# Patient Record
Sex: Female | Born: 1980 | Race: White | Hispanic: No | Marital: Single
Health system: Midwestern US, Academic
[De-identification: ages and names within clinical notes are randomized; demographics above are authoritative.]

---
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
## 2023-06-28 ENCOUNTER — Encounter: Admit: 2023-06-28 | Discharge: 2023-06-28 | Payer: MEDICARE

## 2023-06-28 DIAGNOSIS — N3000 Acute cystitis without hematuria: Secondary | ICD-10-CM

## 2023-06-28 DIAGNOSIS — N898 Other specified noninflammatory disorders of vagina: Secondary | ICD-10-CM

## 2023-06-28 LAB — URINALYSIS DIPSTICK REFLEX TO CULTURE: NITRITE: POSITIVE K/UL — AB (ref 3–12)

## 2023-06-28 LAB — OPIATES-URINE RANDOM: OPIATES: POSITIVE mL/min — AB

## 2023-06-28 LAB — CBC
HEMOGLOBIN: 14 g/dL (ref 12.0–15.0)
MCV: 91 FL (ref 80–100)
RDW: 13 % (ref 11–15)
WBC COUNT: 21 K/UL — ABNORMAL HIGH (ref 4.5–11.0)

## 2023-06-28 LAB — PHENCYCLIDINES-URINE RANDOM: PHENCYCLIDINE (PCP): NEGATIVE

## 2023-06-28 LAB — BENZODIAZEPINES-URINE RANDOM: BENZODIAZEPINES: NEGATIVE K/UL (ref 0–0.80)

## 2023-06-28 LAB — URINALYSIS MICROSCOPIC REFLEX TO CULTURE

## 2023-06-28 LAB — FENTANYL URINE: FENTANYL URINE: NEGATIVE

## 2023-06-28 LAB — BARBITURATES-URINE RANDOM: BARBITURATES: NEGATIVE U/L (ref 7–40)

## 2023-06-28 LAB — COMPREHENSIVE METABOLIC PANEL
ALT: 15 U/L — AB (ref 7–56)
CHLORIDE: 101 MMOL/L — AB (ref 98–110)

## 2023-06-28 MED ORDER — HYDROCODONE-ACETAMINOPHEN 10-325 MG PO TAB
1 | Freq: Once | ORAL | 0 refills | Status: CP
Start: 2023-06-28 — End: ?
  Administered 2023-06-29: 04:00:00 1 via ORAL

## 2023-06-28 MED ORDER — HYDROCODONE-ACETAMINOPHEN 5-325 MG PO TAB
1 | Freq: Once | ORAL | 0 refills | Status: CP
Start: 2023-06-28 — End: ?
  Administered 2023-06-29: 04:00:00 1 via ORAL

## 2023-06-28 MED ORDER — CEPHALEXIN 500 MG PO CAP
500 mg | ORAL_CAPSULE | Freq: Two times a day (BID) | ORAL | 0 refills | Status: AC
Start: 2023-06-28 — End: ?

## 2023-06-28 MED ORDER — ULIPRISTAL 30 MG PO TAB
30 mg | Freq: Once | ORAL | 0 refills | Status: CP
Start: 2023-06-28 — End: ?
  Administered 2023-06-29: 04:00:00 30 mg via ORAL

## 2023-06-28 MED ORDER — CEFTRIAXONE/LIDOCAINE IM 500MG VIAL MIXTURE
500 mg | Freq: Once | INTRAMUSCULAR | 0 refills | Status: CP
Start: 2023-06-28 — End: ?
  Administered 2023-06-29 (×2): 500 mg via INTRAMUSCULAR

## 2023-06-28 MED ORDER — PREGABALIN 75 MG PO CAP
300 mg | Freq: Once | ORAL | 0 refills | Status: CP
Start: 2023-06-28 — End: ?
  Administered 2023-06-29: 04:00:00 300 mg via ORAL

## 2023-06-28 MED ORDER — METRONIDAZOLE 500 MG PO TAB
2000 mg | Freq: Once | ORAL | 0 refills | Status: CP
Start: 2023-06-28 — End: ?
  Administered 2023-06-29: 04:00:00 2000 mg via ORAL

## 2023-06-28 MED ORDER — AZITHROMYCIN 250 MG PO TAB
1000 mg | Freq: Once | ORAL | 0 refills | Status: CP
Start: 2023-06-28 — End: ?
  Administered 2023-06-29: 04:00:00 1000 mg via ORAL

## 2023-06-28 MED ORDER — ONDANSETRON 8 MG PO TBDI
8 mg | Freq: Once | ORAL | 0 refills | Status: CP
Start: 2023-06-28 — End: ?
  Administered 2023-06-29: 04:00:00 8 mg via ORAL

## 2023-06-28 MED ORDER — METHOCARBAMOL 500 MG PO TAB
1000 mg | Freq: Once | ORAL | 0 refills | Status: CP
Start: 2023-06-28 — End: ?
  Administered 2023-06-29: 04:00:00 1000 mg via ORAL

## 2023-06-29 ENCOUNTER — Ambulatory Visit: Admit: 2023-06-29 | Discharge: 2023-06-28 | Payer: MEDICARE

## 2023-06-29 ENCOUNTER — Emergency Department: Admit: 2023-06-29 | Discharge: 2023-06-28 | Payer: MEDICARE
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
## 2023-06-29 NOTE — ED Notes
Pt received instructions and Rx have been sent to pharmacy. All questions and concerns were addressed. Pt had an understanding of the instructions. Pt was in stable condition. Left via their own accord by ambulation.

## 2023-08-23 ENCOUNTER — Encounter: Admit: 2023-08-23 | Discharge: 2023-08-23 | Payer: MEDICARE

## 2023-12-28 ENCOUNTER — Encounter: Admit: 2023-12-28 | Discharge: 2023-12-28 | Payer: MEDICARE

## 2024-01-09 ENCOUNTER — Encounter: Admit: 2024-01-09 | Discharge: 2024-01-09 | Payer: MEDICARE

## 2024-02-01 ENCOUNTER — Encounter: Admit: 2024-02-01 | Discharge: 2024-02-02 | Payer: MEDICARE

## 2024-02-01 ENCOUNTER — Encounter: Admit: 2024-02-01 | Discharge: 2024-02-01 | Payer: MEDICARE

## 2024-02-14 ENCOUNTER — Encounter: Admit: 2024-02-14 | Discharge: 2024-02-14 | Payer: MEDICARE

## 2024-02-14 DIAGNOSIS — M549 Dorsalgia, unspecified: Secondary | ICD-10-CM

## 2024-02-21 ENCOUNTER — Encounter: Admit: 2024-02-21 | Discharge: 2024-02-21 | Payer: MEDICARE
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
## 2024-02-26 ENCOUNTER — Encounter: Admit: 2024-02-26 | Discharge: 2024-02-26 | Payer: MEDICARE

## 2024-02-29 ENCOUNTER — Encounter: Admit: 2024-02-29 | Discharge: 2024-02-29 | Payer: MEDICARE

## 2024-03-01 ENCOUNTER — Encounter: Admit: 2024-03-01 | Discharge: 2024-03-01 | Payer: MEDICARE

## 2024-03-12 ENCOUNTER — Ambulatory Visit: Admit: 2024-03-12 | Discharge: 2024-03-12 | Payer: MEDICARE

## 2024-03-12 ENCOUNTER — Encounter: Admit: 2024-03-12 | Discharge: 2024-03-12 | Payer: MEDICARE

## 2024-03-12 DIAGNOSIS — M549 Dorsalgia, unspecified: Secondary | ICD-10-CM
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
## 2024-07-04 ENCOUNTER — Encounter: Admit: 2024-07-04 | Discharge: 2024-07-04 | Payer: MEDICARE

## 2024-07-05 ENCOUNTER — Encounter: Admit: 2024-07-05 | Discharge: 2024-07-05 | Payer: MEDICARE

## 2024-07-15 ENCOUNTER — Encounter: Admit: 2024-07-15 | Discharge: 2024-07-15 | Payer: MEDICARE

## 2024-09-19 ENCOUNTER — Encounter: Admit: 2024-09-19 | Discharge: 2024-09-19 | Payer: MEDICARE

## 2024-09-20 ENCOUNTER — Encounter: Admit: 2024-09-20 | Discharge: 2024-09-20 | Payer: MEDICARE

## 2024-09-25 ENCOUNTER — Encounter: Admit: 2024-09-25 | Discharge: 2024-09-25 | Payer: MEDICARE

## 2024-12-10 ENCOUNTER — Encounter: Admit: 2024-12-10 | Discharge: 2024-12-10 | Payer: MEDICARE

## 2024-12-10 DIAGNOSIS — J188 Other pneumonia, unspecified organism: Secondary | ICD-10-CM

## 2024-12-10 DIAGNOSIS — Z9911 Dependence on respirator [ventilator] status: Secondary | ICD-10-CM

## 2024-12-10 DIAGNOSIS — T5891XA Toxic effect of carbon monoxide from unspecified source, accidental (unintentional), initial encounter: Secondary | ICD-10-CM

## 2024-12-11 ENCOUNTER — Encounter: Admit: 2024-12-11 | Discharge: 2024-12-11 | Payer: MEDICARE

## 2024-12-11 ENCOUNTER — Inpatient Hospital Stay: Admit: 2024-12-11 | Discharge: 2024-12-11 | Payer: MEDICARE

## 2024-12-11 LAB — BLOOD GASES, PERIPHERAL VENOUS
~~LOC~~ BKR BASE EXCESS-VENOUS: 14 mmol/L
~~LOC~~ BKR BASE EXCESS-VENOUS: 16 mmol/L
~~LOC~~ BKR BASE EXCESS-VENOUS: 14 mmol/L
~~LOC~~ BKR BICARB, VENOUS(CAL): 37 mmol/L
~~LOC~~ BKR BICARB, VENOUS(CAL): 38 mmol/L
~~LOC~~ BKR BICARB, VENOUS(CAL): 37 mmol/L
~~LOC~~ BKR FIO2 VALUE-VENOUS: 70 %
~~LOC~~ BKR FIO2 VALUE-VENOUS: 7 %
~~LOC~~ BKR FIO2 VALUE-VENOUS: 9 %
~~LOC~~ BKR O2 SAT, VENOUS: 87 % — ABNORMAL HIGH (ref 55.0–71.0)
~~LOC~~ BKR O2 SAT, VENOUS: 72 % — ABNORMAL HIGH (ref 55.0–71.0)
~~LOC~~ BKR O2 SAT, VENOUS: 45 % — ABNORMAL LOW (ref 55.0–71.0)
~~LOC~~ BKR O2 SAT, VENOUS: 74 % — ABNORMAL HIGH (ref 55.0–71.0)
~~LOC~~ BKR PCO2-VENOUS: 114 mmHg — ABNORMAL HIGH (ref 36–50)
~~LOC~~ BKR PCO2-VENOUS: 98 mmHg — ABNORMAL HIGH (ref 36–50)
~~LOC~~ BKR PCO2-VENOUS: 91 mmHg — ABNORMAL HIGH (ref 36–50)
~~LOC~~ BKR PCO2-VENOUS: 85 mmHg — ABNORMAL HIGH (ref 36–50)
~~LOC~~ BKR PH-VENOUS: 7.2 — ABNORMAL LOW (ref 7.30–7.40)
~~LOC~~ BKR PH-VENOUS: 7.2 — ABNORMAL LOW (ref 7.30–7.40)
~~LOC~~ BKR PH-VENOUS: 7.3 (ref 7.30–7.40)
~~LOC~~ BKR PH-VENOUS: 7.3 (ref 7.30–7.40)
~~LOC~~ BKR PO2-VENOUS: 41 mmHg (ref 33–48)
~~LOC~~ BKR PO2-VENOUS: 27 mmHg — ABNORMAL LOW (ref 33–48)
~~LOC~~ BKR PO2-VENOUS: 43 mmHg (ref 33–48)

## 2024-12-11 LAB — URINALYSIS DIPSTICK REFLEX TO CULTURE
~~LOC~~ BKR NITRITE: NEGATIVE
~~LOC~~ BKR URINE BILE: NEGATIVE

## 2024-12-11 LAB — RVP VIRAL PANEL PCR

## 2024-12-11 LAB — BASIC METABOLIC PANEL
~~LOC~~ BKR ANION GAP: 9 (ref 3–12)
~~LOC~~ BKR ANION GAP: 9 (ref 3–12)
~~LOC~~ BKR BLD UREA NITROGEN: 9 mg/dL (ref 7–25)
~~LOC~~ BKR BLD UREA NITROGEN: 10 mg/dL (ref 7–25)
~~LOC~~ BKR CALCIUM: 9.2 mg/dL (ref 8.5–10.6)
~~LOC~~ BKR CALCIUM: 9.3 mg/dL (ref 8.5–10.6)
~~LOC~~ BKR CHLORIDE: 92 mmol/L — ABNORMAL LOW (ref 98–110)
~~LOC~~ BKR CHLORIDE: 92 mmol/L — ABNORMAL LOW (ref 98–110)
~~LOC~~ BKR CO2: 42 mmol/L — ABNORMAL HIGH (ref 21–30)
~~LOC~~ BKR CO2: 39 mmol/L — ABNORMAL HIGH (ref 21–30)
~~LOC~~ BKR CREATININE: 0.7 mg/dL (ref 0.40–1.00)
~~LOC~~ BKR CREATININE: 0.7 mg/dL (ref 0.40–1.00)
~~LOC~~ BKR GLOMERULAR FILTRATION RATE (GFR): >60 mL/min (ref >60)
~~LOC~~ BKR GLOMERULAR FILTRATION RATE (GFR): >60 mL/min (ref >60)
~~LOC~~ BKR GLUCOSE, RANDOM: 108 mg/dL — ABNORMAL HIGH (ref 70–100)
~~LOC~~ BKR POTASSIUM: 3.6 mmol/L (ref 3.5–5.1)
~~LOC~~ BKR SODIUM, SERUM: 140 mmol/L (ref 137–147)

## 2024-12-11 LAB — POC BLOOD GAS ARTERIAL
~~LOC~~ BKR POC BICARB, ART: 40 mmol/L — ABNORMAL HIGH (ref 21–28)
~~LOC~~ BKR POC CO2, ART: 111 mmHg — ABNORMAL HIGH (ref 35–45)
~~LOC~~ BKR POC O2 SAT, ART: 93 % — ABNORMAL LOW (ref 95–99)
~~LOC~~ BKR POC PH, ART: 7.1 — CL (ref 7.35–7.45)

## 2024-12-11 LAB — CBC AND DIFF
~~LOC~~ BKR HEMATOCRIT: 45 % — ABNORMAL HIGH (ref 36.0–45.0)
~~LOC~~ BKR RBC COUNT: 4.8 10*6/uL (ref 4.00–5.00)
~~LOC~~ BKR WBC COUNT: 18 10*3/uL — ABNORMAL HIGH (ref 4.50–11.00)
~~LOC~~ BKR WBC COUNT: 15 10*3/uL — ABNORMAL HIGH (ref 4.50–11.00)

## 2024-12-11 LAB — POC HEMATOCRIT&HEMOGLOBIN
~~LOC~~ BKR POC HEMATOCRIT: 50 % — ABNORMAL HIGH (ref 36–45)
~~LOC~~ BKR POC HEMOGLOBIN: 17 g/dL — ABNORMAL HIGH (ref 12.5–15.0)

## 2024-12-11 LAB — ECG 12-LEAD: T AXIS: -4 degrees (ref <15.0)

## 2024-12-11 LAB — HIGH SENSITIVITY TROPONIN I 2 HOUR
~~LOC~~ BKR HIGH SENSITIVITY TROPONIN I 2 HOUR: 5.9 ng/L (ref <15.0)
~~LOC~~ BKR HIGH SENSITIVITY TROPONIN I DELTA VALUE: 1.4

## 2024-12-11 LAB — CARBON MONOXIDE,BG: ~~LOC~~ BKR CARBOXYHEMOGLOBIN: 7.7 % — ABNORMAL HIGH (ref <2.0)

## 2024-12-11 LAB — BLOOD GASES, ARTERIAL
~~LOC~~ BKR BASE EXCESS-ART: 5.1 mmol/L — ABNORMAL HIGH (ref 1.6–2.6)
~~LOC~~ BKR BASE EXCESS-ART: 8.1 mmol/L
~~LOC~~ BKR BICARB, ART(CAL): 31 mmol/L — ABNORMAL HIGH (ref 21.0–28.0)
~~LOC~~ BKR FIO2 VALUE-ART: 100 %
~~LOC~~ BKR O2 SAT-ART: 98 % (ref 95.0–99.0)
~~LOC~~ BKR O2 SAT-ART: 98 % (ref 95.0–99.0)
~~LOC~~ BKR PCO2-ART: 118 mmHg — ABNORMAL HIGH (ref 35–45)
~~LOC~~ BKR PCO2-ART: 117 mmHg — ABNORMAL HIGH (ref 35–45)
~~LOC~~ BKR PH-ART: 7.1 — CL (ref 7.35–7.45)
~~LOC~~ BKR PO2-ART: 138 mmHg — ABNORMAL HIGH (ref 80–100)
~~LOC~~ BKR PO2-ART: 136 mmHg — ABNORMAL HIGH (ref 80–100)

## 2024-12-11 LAB — FREE T4 (FREE THYROXINE) ONLY: ~~LOC~~ BKR FREE T4: 0.8 ng/dL (ref 0.6–1.6)

## 2024-12-11 LAB — COMPREHENSIVE METABOLIC PANEL
~~LOC~~ BKR ALBUMIN: 4.1 g/dL (ref 3.5–5.0)
~~LOC~~ BKR CALCIUM: 9.2 mg/dL — ABNORMAL HIGH (ref 8.5–10.6)
~~LOC~~ BKR CO2: 37 mmol/L — ABNORMAL HIGH (ref 21–30)
~~LOC~~ BKR POTASSIUM: 4.7 mmol/L — CL (ref 3.5–5.1)
~~LOC~~ BKR SODIUM, SERUM: 142 mmol/L (ref 137–147)

## 2024-12-11 LAB — POC POTASSIUM: ~~LOC~~ BKR POC POTASSIUM: 4.5 mmol/L (ref 3.5–5.1)

## 2024-12-11 LAB — BETA-HCG: ~~LOC~~ BKR BETA-HCG: <1 U/L (ref <5)

## 2024-12-11 LAB — MRSA BY PCR (NASAL)

## 2024-12-11 LAB — LEGIONELLA ANTIGEN URINE,RAN: ~~LOC~~ BKR LEGIONELLA URINE AG: NEGATIVE degrees

## 2024-12-11 LAB — MAGNESIUM: ~~LOC~~ BKR MAGNESIUM: 1.8 mg/dL — ABNORMAL HIGH (ref 1.6–2.6)

## 2024-12-11 LAB — NT-PRO-BNP: ~~LOC~~ BKR NT-PRO-BNP: 58 pg/mL (ref 0.2–<125)

## 2024-12-11 LAB — PHOSPHORUS  CELLULAR THERAPEUTICS: ~~LOC~~ BKR PHOSPHORUS: 4.4 mg/dL (ref 2.0–4.5)

## 2024-12-11 LAB — TSH WITH FREE T4 REFLEX: ~~LOC~~ BKR TSH: 5.2 [IU]/mL — ABNORMAL HIGH (ref 0.35–5.00)

## 2024-12-11 LAB — PROTIME INR (PT): ~~LOC~~ BKR INR: 1 s (ref 0.9–1.2)

## 2024-12-11 MED ORDER — VANCOMYCIN PHARMACY TO MANAGE
1 | 0 refills | Status: DC
Start: 2024-12-11 — End: 2024-12-11

## 2024-12-11 MED ORDER — POLYETHYLENE GLYCOL 3350 17 GRAM PO PWPK
1 | Freq: Two times a day (BID) | ORAL | 0 refills | Status: DC
Start: 2024-12-11 — End: 2024-12-16
  Administered 2024-12-16 (×2): 17 g via ORAL

## 2024-12-11 MED ORDER — POTASSIUM CHLORIDE 20 MEQ PO TBTQ
40 meq | Freq: Once | ORAL | 0 refills | Status: DC
Start: 2024-12-11 — End: 2024-12-12

## 2024-12-11 MED ORDER — HALOPERIDOL LACTATE 5 MG/ML IJ SOLN
5 mg | INTRAVENOUS | 0 refills | Status: DC | PRN
Start: 2024-12-11 — End: 2024-12-16
  Administered 2024-12-12: 03:00:00 5 mg via INTRAVENOUS

## 2024-12-11 MED ORDER — QUETIAPINE 25 MG PO TAB
25 mg | Freq: Two times a day (BID) | ORAL | 0 refills | Status: DC
Start: 2024-12-11 — End: 2024-12-11
  Administered 2024-12-11: 18:00:00 25 mg via ORAL

## 2024-12-11 MED ORDER — SODIUM CHLORIDE 0.9 % TKO FLUID
INTRAVENOUS | 0 refills | Status: DC
Start: 2024-12-11 — End: 2024-12-16
  Administered 2024-12-11 – 2024-12-14 (×2): via INTRAVENOUS

## 2024-12-11 MED ORDER — VANCOMYCIN 1,500 MG IVPB (VIAL/BAG ADAPTER)
1500 mg | Freq: Two times a day (BID) | INTRAVENOUS | 0 refills | Status: DC
Start: 2024-12-11 — End: 2024-12-11

## 2024-12-11 MED ORDER — METOPROLOL TARTRATE 25 MG PO TAB
25 mg | Freq: Two times a day (BID) | ORAL | 0 refills | Status: DC
Start: 2024-12-11 — End: 2024-12-16
  Administered 2024-12-11 – 2024-12-16 (×10): 25 mg via ORAL

## 2024-12-11 MED ORDER — IPRATROPIUM-ALBUTEROL 0.5 MG-3 MG(2.5 MG BASE)/3 ML IN NEBU
3 mL | RESPIRATORY_TRACT | 0 refills | Status: DC | PRN
Start: 2024-12-11 — End: 2024-12-13
  Administered 2024-12-11 – 2024-12-13 (×8): 3 mL via RESPIRATORY_TRACT

## 2024-12-11 MED ORDER — NALOXONE 4 MG/250 ML IV INFUSION
.1-.8 mg/h | INTRAVENOUS | 0 refills | Status: DC
Start: 2024-12-11 — End: 2024-12-11
  Administered 2024-12-11 (×2): 0.1 mg/h via INTRAVENOUS

## 2024-12-11 MED ORDER — VANCOMYCIN 2,500 MG IVPB
20 mg/kg | Freq: Once | INTRAVENOUS | 0 refills | Status: DC
Start: 2024-12-11 — End: 2024-12-11

## 2024-12-11 MED ORDER — FUROSEMIDE 10 MG/ML IJ SOLN
60 mg | Freq: Once | INTRAVENOUS | 0 refills | Status: CP
Start: 2024-12-11 — End: ?
  Administered 2024-12-11: 08:00:00 60 mg via INTRAVENOUS

## 2024-12-11 MED ORDER — ENOXAPARIN 40 MG/0.4 ML SC SYRG
40 mg | Freq: Every day | SUBCUTANEOUS | 0 refills | Status: DC
Start: 2024-12-11 — End: 2024-12-11

## 2024-12-11 MED ORDER — ACETAMINOPHEN 1,000 MG/100 ML (10 MG/ML) IV SOLN
1000 mg | INTRAVENOUS | 0 refills | Status: DC | PRN
Start: 2024-12-11 — End: 2024-12-16
  Administered 2024-12-12 – 2024-12-13 (×2): 1000 mg via INTRAVENOUS

## 2024-12-11 MED ORDER — DOXYCYCLINE 100 MG/100 ML IVPB (MB+)
100 mg | Freq: Two times a day (BID) | INTRAVENOUS | 0 refills | Status: DC
Start: 2024-12-11 — End: 2024-12-11
  Administered 2024-12-11 (×2): 100 mg via INTRAVENOUS

## 2024-12-11 MED ORDER — DEXMEDETOMIDINE IN 0.9 % NACL 400 MCG/100 ML (4 MCG/ML) IV SOLN
.2-1 ug/kg/h | INTRAVENOUS | 0 refills | Status: DC
Start: 2024-12-11 — End: 2024-12-13
  Administered 2024-12-12: 05:00:00 0.2 ug/kg/h via INTRAVENOUS
  Administered 2024-12-12 – 2024-12-13 (×3): 0.3 ug/kg/h via INTRAVENOUS

## 2024-12-11 MED ORDER — NALOXONE 0.4 MG/ML IJ SOLN
.4 mg | Freq: Once | INTRAVENOUS | 0 refills | Status: CP
Start: 2024-12-11 — End: ?
  Administered 2024-12-11: 10:00:00 0.15 mg via INTRAVENOUS

## 2024-12-11 MED ORDER — ENOXAPARIN 40 MG/0.4 ML SC SYRG
40 mg | Freq: Two times a day (BID) | SUBCUTANEOUS | 0 refills | Status: DC
Start: 2024-12-11 — End: 2024-12-16
  Administered 2024-12-12 – 2024-12-16 (×9): 40 mg via SUBCUTANEOUS

## 2024-12-11 MED ORDER — LABETALOL 5 MG/ML IV SOLN
10 mg | INTRAVENOUS | 0 refills | Status: DC | PRN
Start: 2024-12-11 — End: 2024-12-15

## 2024-12-11 MED ORDER — NALOXONE 0.4 MG/ML IJ SOLN
.4 mg | Freq: Once | INTRAVENOUS | 0 refills | Status: CP
Start: 2024-12-11 — End: ?

## 2024-12-11 MED ORDER — PIPERACILLIN/TAZOBACTAM 4.5 G/100ML NS IVPB (MB+)
4.5 g | INTRAVENOUS | 0 refills | Status: CP
Start: 2024-12-11 — End: ?
  Administered 2024-12-11 – 2024-12-16 (×40): 4.5 g via INTRAVENOUS

## 2024-12-11 MED ORDER — HALOPERIDOL 5 MG PO TAB
5 mg | ORAL | 0 refills | Status: DC | PRN
Start: 2024-12-11 — End: 2024-12-16

## 2024-12-11 MED ORDER — LEVOTHYROXINE 125 MCG PO TAB
125 ug | Freq: Every day | ORAL | 0 refills | Status: DC
Start: 2024-12-11 — End: 2024-12-16
  Administered 2024-12-12 – 2024-12-16 (×5): 125 ug via ORAL

## 2024-12-11 MED ORDER — ROSUVASTATIN 20 MG PO TAB
20 mg | Freq: Every day | ORAL | 0 refills | Status: DC
Start: 2024-12-11 — End: 2024-12-16
  Administered 2024-12-12 – 2024-12-16 (×5): 20 mg via ORAL

## 2024-12-11 MED ORDER — QUETIAPINE 25 MG PO TAB
50 mg | Freq: Two times a day (BID) | ORAL | 0 refills | Status: DC
Start: 2024-12-11 — End: 2024-12-16
  Administered 2024-12-12 – 2024-12-16 (×9): 50 mg via ORAL

## 2024-12-11 MED ADMIN — NALOXONE 0.4 MG/ML IJ SOLN [5373]: 0.2 mg | INTRAVENOUS | @ 08:00:00 | Stop: 2024-12-11 | NDC 67457029200

## 2024-12-11 NOTE — Progress Notes [1]
 Critical Care Progress Note    Sandra Rios  Today's Date:  12/12/2024  Admission Date: 12/11/2024  LOS: 1 day    Principal Problem:    Acute on chronic respiratory failure with hypoxia and hypercapnia (CMS-HCC)  Active Problems:    Carboxyhemoglobinemia    Multifocal pneumonia    On mechanically assisted ventilation (CMS-HCC)    Sepsis, unspecified organism (CMS-HCC)    Acute metabolic encephalopathy    Ground-level fall    Chronic pain syndrome    Brief Hospital Course:      Sandra Rios is a 43 y.o. female w/ PMH of bipolar disorder, chronic pain syndrome, current tobacco use, COPD, chronic hypoxic respiratory failure on 2-3L NC at baseline, morbid obesity, hypothyroidism, & SLE. Several recent admits to OSH this year for hypoxic respiratory failure; most recently 12/5 - 12/10 requiring NIV & treated for pna.    Presented to [facility] 12/16 w/ loss of consciousness following ground level fall with head strike. Trauma workup negative. ABG w/ acute on chronic hypoxia and hypercapnia so placed on NIPPV. Imaging w/ worsening multifocal pneumonia. Also noted to have elevated carboxyhemoglobin level. Transferred to [facility] 12/17 for further management d/t persistent hypercapnia.    Continued to be lethargic w/ hypercapnia despite maximizing NIV settings. S/p Narcan  w/ improvement in mental status so started on narcan  gtt. VBG improved & transitioned off NIV. Hyperbaric medicine consulted but pt not candidate for therapy given NIV requirement & improving carboxyhemoglobin level.     Narcan  gtt later stopped d/t intermittent agitation; low dose PTA anti-psychotics resumed & Psych consulted given hx of severe agitation. Now requiring Precedex  gtt.    Infectious w/u sent & started on broad abx.    Assessment/Plan:      NEURO  Acute Metabolic Encephalopathy  Fall  Bipolar Disorder  Anxiety/Depression  Chronic Pain  - Encephalopathy likely 2/2 profound hypercapnia  - PTA cyclobenzaprine  10 mg TID PRN, doxepin  100 mg BID, hydrocodone -acetaminophen  5/325 mg TID PRN, ibuprofen 200 mg q6 PRN, Lyrica  300 mg BID, Seroquel  50 mg BID, trazodone  150 mg qHS PRN, invega Sustenna 234mg  q30 days  - Has had agitation requiring psychiatry consult during past inpatient admissions at [facility]  - Presented to OSH 12/16 after witnessed ground level fall at home; trauma workup negative at OSH ED  - OSH CT Head 12/16: No acute intracranial findings. No panenchymal changes to indicate carbon monoxide poisoning.  - OSH CT C-Spine w/o 12/16: No acute osseous injury of the cervical spine.  - OSH ETOH 12/16: < 10  - OSH UDS 12/16: + TCA, THC, opiates (expected w/ PTA meds)  - On admit to MICU 12/17 was lethargic but arousable -> administered Narcan  w/ improvement so started gtt  - Later w/ intermittent agitation -> Narcan  gtt stopped, low dose PTA Seroquel  resumed, & Psych consulted  - Required addition of Precedex  overnight; currently 0.4 mcg/kg/hr  PLAN  - Limit Precedex  as able  - Psych following         - Seroquel  25mg  BID (currently refusing)         - Haldol  5mg  Q6 PRN agitation (1x dose last 24h)         - Resume PTA Norco         - Hold remaining PTA meds -- will work w/ pharmacy on med rec     PULM  Acute on Chronic Respiratory Failure with Hypoxia and Hypercapnia  Multifocal Pneumonia  Carboxyhemoglobinemia  COPD  OSA/OHS?  -  Unclear cause of acute on chronic respiratory failure; ddx COPD exacerbation vs worsening of pneumonia vs ?aspiration in the setting of LOC/altered mental status  - PTA Duoneb TID  - Baseline O2 2L at rest, 3L with exertion  - [facility] admit 07/2024 - 08/2024 - required intubation & proning for ARDS; [facility] admit 11/29/24 - 12/04/24 with pneumonia, developed hypoxia & hypercapnia requiring NIPPV  - Presented to OSH 12/16 after fall as above; found to be hypoxic & hypercapnic -> NIV  - OSH carboxyhemoglobin: 21.2 -> 16.7 (reportedly family did not allow EMS to check carbon monoxide levels in the house); level at [facility] improved, down to 7.7  - OSH CTA Chest 12/16: No PE. Patchy linear consolidation throughout the lungs, similar to the previous exam. Left lower lobe opacification has mildly progressed since the previous exam. Findings most compatible with multifocal pneumonia.  - Continued on NIV upon admit w/ MICU w/ worsening hypercapnia despite maximizing settings (7.18 / 117) -> given narcan  as above w/ improvement  - Evaluated by Hyperbaric medicine earlier this admit & was deemed not a candidate d/t NIV requirement  - Pro-BNP 12/17: 58  - I/O: net -   - Transitioned off NIV 12/17 though wore overnight  - Currently 7L NC  PLAN  - Duonebs Q6 & PRN  - Add prednisone  40mg  daily x5d w/ new wheezing this AM  - ID as below  - Dc NIV     CV  Tachycardia  HTN  HLD  - PTA furosemide  40 mg daily, metoprolol  tartrate 50 mg BID, rosuvastatin  20 mg qHS, spironolactone  50 mg daily  - ECG 12/16: Sinus tach, HR 110, QTC 452  - HR 70s-80s, SBP 100s-120s  PLAN  - Continue PTA metop at decreased dose 25mg  BID  - Continue PTA rosuvastatin    - Resume PTA furosemide  & spironolactone    - Labetalol  PRN for SBP > 160     GI  Morbid Obesity  - Last BM ~12/14  - Regular diet  - Miralax  daily     RENAL  - Cr 0.69, BUN 15, CO2 35  - I/O as above   - CMP, mg, phos daily      ENDO  Hypothyroidism  - Labs from 12/17: TSH 5.24, free T4 WNL  - Continue PTA levothyroxine  125 mcg daily     ID  Leukocytosis  Multifocal Pneumonia  Sepsis  Hx Stenotrophomonas Pneumonia (September 2025)  - Recent admits to OSH w/ pneumonia; treated for steno PNA September 2025 w/ levaquin, recently treated again for PNA w/ levaquin (finished course 12/09/24)  - Endorses that her mother has been sick with URI, denies infectious prodrome  - Procal 0.08  - Urine cx 12/17: negative   - MRSA PCR, RVP 12/17: negative  - Blood cx 12/17: NGTD  - WBC 16, afebrile  PLAN  - Continue empiric Zosyn  (12/17 --  ) for now  - Sputum cx ordered, pending collection      HEME  - Hgb 14.5, plt 509  - Daily CBC  - Lovenox  ppx     PPX:  Lines: PIVs  Drains/Tubes: foley   DVT: Lovenox   GI: not indicated     Code Status: Full  Disposition: Continue ICU care      Pt critically ill with above diagnoses. I spent 45 minutes providing critical care services including:  performing a physical examination  serially reviewing laboratory, telemetry,  hemodynamic, oximetry, and respiratory data  reviewing radiographic images  reviewing medications  managing fluids/electrolytes, antibiotics, ICU prophylaxis, and mechanical ventilation   developing the overall plan of care.    Kreg Fruits, APRN, ACAGNP-C  Pulm/Critical Care  Available on Voalte  M2 Pager [phone]  12/12/2024 1335 time  __________________________________________________________________________________    Subjective:     Sandra Rios is a 43 y.o. female who is resting quietly on NIV at the time of my visit. CO at bedside reports pt has been calm w/ no acute issues this AM. No family at bedside.    On return visit, pt awake, alert, & calm -- asking for PTA medications (specifically pain meds) to be resumed.     Objective:     Medications:  Scheduled Meds:albuterol -ipratropium (DUONEB) nebulizer solution 3 mL, 3 mL, Inhalation, Q6H & PRN  enoxaparin  (LOVENOX ) syringe 40 mg, 40 mg, Subcutaneous, BID  levothyroxine  (SYNTHROID ) tablet 125 mcg, 125 mcg, Oral, QDAY  metoprolol  tartrate tablet 25 mg, 25 mg, Oral, BID  piperacillin /tazobactam (ZOSYN ) 4.5 g in sodium chloride  0.9% (NS) 100 mL IVPB (MB+), 4.5 g, Intravenous, Q6H*  polyethylene glycol 3350  (MIRALAX ) packet 17 g, 1 packet, Oral, BID  QUEtiapine  (SEROquel ) tablet 50 mg, 50 mg, Oral, BID  rosuvastatin  (CRESTOR ) tablet 20 mg, 20 mg, Oral, QDAY    Continuous Infusions:   dexMEDEtomidine  (PRECEDEX ) 400 mcg/NS 100 ml IV drip (premade) 0.3 mcg/kg/hr (12/11/24 2334)    sodium chloride  0.9% TKO infusion 5 mL/hr at 12/11/24 0638     PRN and Respiratory Meds:acetaminophen  Q6H PRN, haloperidoL  Q6H PRN **OR** haloperidol  lactate Q6H PRN, labetalol  Q4H PRN                       Vital Signs: Last Filed                  Vital Signs: 24 Hour Range   BP: 106/63 (12/18 0700)  Temp: 37 ?C (98.6 ?F) (12/18 0400)  Pulse: 83 (12/18 0700)  Respirations: 20 PER MINUTE (12/18 0700)  SpO2: 95 % (12/18 0700)  O2%: 70 % (12/18 0700)  O2 Device: CPAP/BiPAP (12/18 0700)  O2 Liter Flow: 10 Lpm (12/17 2300) BP: (101-159)/(58-92)   Temp:  [36.8 ?C (98.3 ?F)-37.4 ?C (99.3 ?F)]   Pulse:  [77-108]   Respirations:  [12 PER MINUTE-27 PER MINUTE]   SpO2:  [85 %-97 %]   O2%:  [70 %-80 %]   O2 Device: CPAP/BiPAP  O2 Liter Flow: 10 Lpm      Vitals:    12/11/24 0015 12/11/24 0500 12/12/24 0500   Weight: 118.9 kg (262 lb 2 oz) 113.9 kg (251 lb 1.7 oz) 115.7 kg (255 lb 1.2 oz)           Intake/Output Summary:  (Last 24 hours)    Intake/Output Summary (Last 24 hours) at 12/12/2024 9257  Last data filed at 12/12/2024 0700  Gross per 24 hour   Intake 585.31 ml   Output 1100 ml   Net -514.69 ml         Physical Exam:      General: calm, no apparent distress  HEENT: normocephalic, atraumatic  Lungs: wheezes bilaterally  Heart: S1, S2; RRR  Abdomen: obese, + bowel sounds, non-distended  Extremities: trace BLE edema  Skin: warm, dry; no rashes or lesions to exposed skin  Neurologic: alert, calm    Artificial airway:  None  Ventilator/ Respiratory Therapy:  No     Vent weaning trial:  Not applicable      Laboratory:  LABS:  Recent Labs     12/11/24  0024 12/11/24  0215 12/11/24  0541 12/11/24  1447 12/12/24  0354   NA 141 142 143 140 142   K 4.6 4.7 4.2 3.6 3.7   CL 98 97* 92* 92* 95*   CO2 34* 37* 42* 39* 35*   GAP 9 8 9 9 12    BUN 10 10 9 10 15    CR 0.58 0.63 0.76 0.73 0.69   GLU 106* 115* 144* 108* 101*   CA 9.0 9.2 9.2 9.3 9.3   ALBUMIN 4.0 4.1  --   --  4.0   MG 2.1 2.0 1.8  --  2.1   PO4 4.1 4.4  --   --  2.9   TSH 5.24*  --   --   --   --         Recent Labs 12/11/24  0024 12/11/24  0215 12/12/24  0354   WBC 18.70* 15.20* 16.00*   HGB 14.7 14.2 14.5   HCT 44.8 45.1* 43.7   PLTCT 467* 481* 509*   PT  --  11.2  --    INR  --  1.0  --    PTT  --  32.0  --    AST 13 15 14    ALT 9 10 8    ALKPHOS 87 91 80      Estimated Creatinine Clearance: 131.5 mL/min (by C-G formula based on SCr of 0.69 mg/dL).  Vitals:    12/11/24 0015 12/11/24 0500 12/12/24 0500   Weight: 118.9 kg (262 lb 2 oz) 113.9 kg (251 lb 1.7 oz) 115.7 kg (255 lb 1.2 oz)      Recent Labs     12/11/24  0324 12/12/24  0413   PHART 7.18* 7.44   PO2ART 136* 73*                 Radiology and Other Diagnostic Procedures Review:    Reviewed pertinent studies.

## 2024-12-11 NOTE — Progress Notes [1]
 0003 Patient arrives to unit with EMS. This RN assumed care of patient. Initial assessment complete. Patient is A/Ox4 but lethargic, sinus tach on telemetry rate 110s, hypertensive SBP 150, satting 93% on NRB 15L. See doc flowsheets for all other details.    0029 POC ABG shows pH 7.16 and pCO2 111, patient placed on BiPAP at this time.    0200 Repeat ABG shows pH 7.15 and pCO2 118, Oneil Port, APRN notified and to bedside. Nasal trumpet attempted, pt unable to tolerate. 0.2 mg narcan  given with positive response from patient.    9661 Repeat ABG pH 7.18 and pCO2 117. Oneil Port, APRN notified. Orders placed.     ~0400 Reassessment completed, no new changes at this time. See doc flowsheet for details. VSS per patient trend.    ~9269 BSSC completed with Clotilda, RN.

## 2024-12-11 NOTE — Progress Notes [1]
 Pharmacy Vancomycin  Initiation Note  Subjective:   Sandra Rios is a 43 y.o. female being treated for lower respiratory infection.    Assessment:   Target levels for this patient:  1.  AUC (mcg*h/mL):  400-600  2.  Trough (mcg/mL): 15-20    Plan:   Vancomycin  load recieved at OSH (completed ~ 0000 on 12/17).  Will continue with 1500mg  IV q12h regimen for pt with BMI of 42  Next scheduled level(s): TBD Day Shift Pharmacist  Pharmacy will continue to monitor and adjust therapy as needed.    Objective:     Current Vancomycin  Orders   Medication Dose Route Frequency    vancomycin  (VANCOCIN ) 1,500 mg in sodium chloride  0.9% 250 mL IVPB (Vial/Bag Adapter)  1,500 mg Intravenous Q12H*    vancomycin , pharmacy to manage  1 each Service Per Pharmacy       Recent Vancomycin  Dosing and Administration:  Recent Vancomycin  Admin        Ordered but not administered                    Start date of vancomycin  therapy: 12/11/2024    White Blood Cells   Date/Time Value Ref Range Status   12/11/2024 0024 18.70 (H) 4.50 - 11.00 10*3/uL Final     Creatinine   Date/Time Value Ref Range Status   12/11/2024 0024 0.58 0.40 - 1.00 mg/dL Final     Blood Urea Nitrogen   Date/Time Value Ref Range Status   12/11/2024 0024 10 7 - 25 mg/dL Final     Estimated CrCl:      Intake/Output Summary (Last 24 hours) at 12/11/2024 0158  Last data filed at 12/11/2024 0100  Gross per 24 hour   Intake --   Output 515 ml   Net -515 ml      Actual Weight:  118.9 kg (262 lb 2 oz)    Dacian Orrico, [facility]  12/11/2024
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
## 2024-12-11 NOTE — Progress Notes [1]
 ~9269:  Pt care assumed and bssc complete.     ~0800:  Pt physical assessment complete. Pt is alert and oriented to person, place (knows she is at the hospital, not sure which one), time and vague situation. She is calm and cooperative and follows commands x 4 extremities. Tolerating bipap. VS stable. See flow sheet for complete VS/assessment findings. Will continue to monitor.     ~0930:  Pt intermittently removing monitoring devices and bipap mask. She is mostly redirectable.     ~1000:  Pt repeatedly removing monitors and nasal cannula. Multiple attempts made to redirect pt without success. Pt getting increasingly aggressive with staff, grabbing at myself and others when attempting to reapply/reattach monitors etc which progressed to attempting to kick myself and the tech. Pt getting increasingly harder to safely control. Zebedee, APRN notified. Soft restraints applied to bilateral wrists and ankles. CO at bedside. Will continue to monitor.     ~1020:  Pt getting increasingly agitated and confused, having hallucinations, shouting loudly that she wants her meds which she claims to see across the room (they are not there). She is continuing to grab at staff when they attempt cares. Myself and the tech attempted to replace bipap d/t increasing confusion. Pt grabbing at my arms and grabbed ahold of bipap, ripping it apart/separating connections in multiple places. Pt would not let go when asked, was not following commands, was not redirectable and was yelling louder. Zebedee, APRN notified. Bilateral mitts added. Bipap mask able to be successfully reapplied. Will continue to monitor.     ~1200:  Pt physical assessment complete. Pt is intermittently oriented to person, place (knows she is at the hospital, not sure which one), month and year. VS stable. Continues to be intermittently aggressive with staff. Will continue to monitor.     ~1445:  Pt resting comfortably but gets agitated when staff attempts to provide care. Pt asking to have restraints removed, however, when I attempted to re-apply her BP cuff, she began trying to hit me and began calling me a bitch.  Pt intermittently talking to people in the room who aren't there. Intermittently yelling out random things as well.     ~1600:  Pt physical assessment complete. No significant changes from previous assessment. Will continue to monitor.     ~1730:  Pt refusing oral potassium. Zebedee, APRN notified. Will recheck potassium with am labs. Pt on 10L HFNC with O2 sats 88-89% (goal is 88%). Pt has been on >8L HFNC since bipap was removed this morning. Per pt, her baseline O2 requirement is 3L. Zebedee, APRN notified of this as well. No new orders received,.     ~1800:  Pt continues to be verbally aggressive throughout the afternoon. Several attempts made to loosen restraints. Each time, pt becomes physically aggressive, pulling away from staff during cares or trying to hit them. CO remains at bedside. Will continue to monitor.     ~2000:  Pt physical assessment complete. Pt remains uncooperative, occasionally shouting out profanities and remains physically aggressive with staff when restraints are removed/loosened for cares etc. She is refusing to answer most orientation questions as well. VS remain stable. CO at bedside. Will continue to monitor.     ~2030:  Pt refusing all po medications as well as sub q lovenox  shot. 5 mg IV haldol  given.    ~2100:  Pt resting comfortably.     ~2215:  Pt refusing to wear bipap mask. RT at bedside providing education. Pt continuing to refuse.  Joesph, APRN notified and to come to bedside.     ~2250:  Precedex  gtt started. Will attempt to apply bipap again soon.    ~2330:  Report given to oncoming RN.
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
## 2024-12-12 LAB — CULTURE-URINE W/SENSITIVITY

## 2024-12-12 MED ORDER — PREDNISONE 20 MG PO TAB
40 mg | Freq: Every day | ORAL | 0 refills | Status: DC
Start: 2024-12-12 — End: 2024-12-12
  Administered 2024-12-12: 20:00:00 40 mg via ORAL

## 2024-12-12 MED ORDER — FUROSEMIDE 40 MG PO TAB
40 mg | Freq: Every day | ORAL | 0 refills | Status: DC
Start: 2024-12-12 — End: 2024-12-16
  Administered 2024-12-12 – 2024-12-16 (×5): 40 mg via ORAL

## 2024-12-12 MED ORDER — PREDNISONE 20 MG PO TAB
40 mg | Freq: Every day | ORAL | 0 refills | Status: CP
Start: 2024-12-12 — End: ?
  Administered 2024-12-13 – 2024-12-16 (×4): 40 mg via ORAL

## 2024-12-12 MED ORDER — HYDROCODONE-ACETAMINOPHEN 5-325 MG PO TAB
1 | ORAL | 0 refills | Status: DC | PRN
Start: 2024-12-12 — End: 2024-12-16
  Administered 2024-12-12 – 2024-12-16 (×12): 1 via ORAL

## 2024-12-12 MED ORDER — SPIRONOLACTONE 50 MG PO TAB
50 mg | Freq: Every day | ORAL | 0 refills | Status: DC
Start: 2024-12-12 — End: 2024-12-16
  Administered 2024-12-12 – 2024-12-16 (×5): 50 mg via ORAL

## 2024-12-12 NOTE — Progress Notes [1]
 PHYSICAL THERAPY  ASSESSMENT      Name: Sandra Rios   MRN: [id number]     DOB: 09-22-1981      Age: 43 y.o.  Admission Date: 12/11/2024     LOS: 1 day     Date of Service: 12/12/2024      Mobility  Patient Turn/Position: Self  Mobility Level [facility] Highest Level of Mobility (JH-HLM): Walk 10 steps or more (to the bathroom)  Distance Walked (feet): 6 ft  Level of Assistance: Assist X1  Assistive Device: Walker  Activity Limited By: Lerry / Medical Devices;Weakness    Subjective  Reason for Admission and Past Medical Hx: Sandra Rios is a 43 y.o. female w/ PMH of bipolar disorder, chronic pain syndrome, current tobacco use, COPD, chronic hypoxic respiratory failure on 2-3L NC at baseline, morbid obesity, hypothyroidism, & SLE. Several recent admits to OSH this year for hypoxic respiratory failure; most recently 12/5 - 12/10 requiring NIV & treated for pna. Presented to [facility] 12/16 w/ loss of consciousness following ground level fall with head strike. Trauma workup negative. ABG w/ acute on chronic hypoxia and hypercapnia so placed on NIPPV. Imaging w/ worsening multifocal pneumonia. Also noted to have elevated carboxyhemoglobin level. Transferred to [facility] 12/17 for further management d/t persistent hypercapnia.  Special Considerations: Current oxygen requirement  Comments: 12/18: 6L O2 via NC currently. At baseline wears 3L via NC throughout the day  Mental / Cognitive: Alert;Cooperative;Follows commands  Pain: No complaint of pain  Pain Interventions: Patient agrees to participate in therapy with current pain level  Persons Present: Occupational Therapist    Home Living Situation  Lives With: Family  Comments: Mother  Type of Home: House  Comments: CM notes indicate there are stairs to enter but does not state how many. Patient reports there are no stairs.  In-Home Stairs: Able to live on main level  Bathroom Setup: Tub/shower unit  Patient Owned Equipment:  (OSH notes report patient has a roller walker, but patient today reports she no longer has this. Patient is a questionable historian at present time.)  Comments: Patient is currently a questionable historian. She reports she lives alone and that her mother used to live with her. Patient is unable to recall most details about home set up.    Prior Level of Function  Level Of Independence: Assist needed for mobility related ADL;Assist needed for IADL  Comments: per CM notes - patient reports she is normally independent  History of Falls in Past 3 Months: Yes  Comments: Patient reports 2-3 falls but cannot recall what caused these falls.    ROM  ROM Position Assessed: Seated  R UE ROM: WFL   R UE ROM Method: Active  L UE ROM: WFL   L UE ROM Method: Active  R LE ROM: WFL  R LE ROM Method: Active  L LE ROM: WFL  L LE ROM Method: Active    Strength  Strength Position Assessed: Seated  Overall Strength: Able to move all joints independently through available ROM  R UE Strength: WFL  L UE Strength: WFL    Bed Mobility/Transfer  Bed Mobility: Supine to Sit: Standby Assist;Head of Bed Elevated;Use of Rail  Bed Mobility: Sit to Supine: Standby Assist;Bed Flat;Verbal Cues;Requires Extra Time  Transfer Type: Sit to Stand  Transfer: Assistance Level: From;Bed;Standby Assist  Transfer: Assistive Device: Nurse, Adult  Transfers: Type Of Assistance: For Safety Considerations;For Strength Deficit;For Balance;Verbal Cues  Other Transfer Type: Sit to/from  Stand  Other Transfer: Assistance Level: To/From;Toilet;Standby Assist  Other Transfer: Assistive Device: Nurse, Adult  Other Transfer: Type Of Assistance: For Safety Considerations;For Strength Deficit;For Balance;Verbal Cues  End Of Activity Status: In Bed;Nursing Notified;Instructed Patient to Use Call Light;Instructed Patient to Request Assist with Mobility    Balance  Sitting Balance: Static Sitting Balance;Dynamic Sitting Balance;2 UE Support;Standby Assist;Uneven Surface  Standing Balance: Static Standing Balance;Dynamic Standing Balance;2 UE support;Minimal Assist;Even Surface    Gait  Gait Distance: 6 feet  Gait: Assistance Level: Minimal Assist;Management of Lines  Gait: Assistive Device: Roller Walker  Gait: Descriptors: Pace: Normal;No balance loss  Activity Limited By: Complaint of Fatigue;Complaint of Pain  Comments: Declines further gait and sitting in recliner due to back pain.    Education  Persons Educated: Patient  Patient Barriers To Learning: Confusion;Family Not Present  Interventions: Repetition of Instructions;Demonstration Provided  Teaching Methods: Verbal Instruction;Demonstration  Patient Response: More Instruction Required  Topics: Plan/Goals of PT Interventions;Use of Assistive Device/Orthosis;Mobility Progression;Safety Awareness;Up with Assist Only;Importance of Increasing Activity;Recommend Continued Therapy    Assessment/Progress  Impaired Mobility Due To: Decreased Activity Tolerance;Decreased Strength;Pain;Impaired Balance;Safety Concerns  Assessment/Progress: Should Improve w/ Continued PT    AM-PAC 6 Clicks Basic Mobility Inpatient  Turning from your back to your side while in a flat bed without using bed rails: None  Moving from lying on your back to sitting on the side of a flat bed without using bedrails : None  Moving to and from a bed to a chair (including a wheelchair): A Little  Standing up from a chair using your arms (e.g. wheelchair, or bedside chair): None  To walk in hospital room: A Little  Climbing 3-5 steps with a railing: A Little  Basic Mobility Inpatient Raw Score: 21  Standardized (T-scale) Score: 45.55  Mobility Goal [facility]: JH-HLM 6 Walk >= 10 steps    Goals  Goal Formulation: With Patient  Time For Goal Achievement: 3 days, To, 7 days  Patient Will Go Supine To/From Sit: Independently  Patient Will Transfer Bed/Chair: Independently  Patient Will Transfer Sit to Stand: Independently  Patient Will Ambulate: 101-150 Feet, w/ Stand By Assist (With least-restrictive device)  Patient Will Go Up / Down Stairs: 1-2 Stairs, w/ Stand By Assist    Plan  Treatment Interventions: Mobility training;Strengthening  Plan Frequency: 1-2 Days per Week  PT Plan for Next Visit: Progress gait distance/quality with ongoing use of the roller walker. Encourage energy conservation strategies.    PT Discharge Recommendations  Recommendation: Anticipate home with assistance at time of discharge  Patient Currently Requires Physical Assist With: Ambulation;Transfers;Stairs  Patient Currently Requires Equipment:  (Need to confirm home setup. Previous notes state patient has a roller walker, but patient denies this on evaluation today.)      Therapist  Donnice Morel, PT, DPT [phone]  Date  12/12/2024
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
## 2024-12-12 NOTE — Progress Notes [1]
 Psychiatric Consultation Progress Note    LOS: 1 day    Current Psychotropic Meds:   Seroquel  25 mg BID for agitation  Haldol  5 mg PO/IM Q6H PRN for agitation    Psychiatric Assessment:  Acute metabolic encephalopathy, improving  Bipolar 1 disorder, per history  Chronic pain with chronic opioid use  Fall  Acute on chronic respiratory failure with hypoxia and hypercapnia  Carboxyhemoglobinemia    Recommendations:  Continue Seroquel  25 mg twice daily for agitation  Continue Haldol  5 mg p.o./IM every 6 hours as needed for agitation  Engaged pharmacy help in determining date of last administered Invega injection  Last filled invega 324 mg injection on 12/12 at [facility] pharmacy (one she's filled other prescriptions at recently)  Have been unable to confirm if patient received injection on this date or it was just filled then  Pharmacy has a call out to the pharmacy/clinic but had to leave a voicemail and is waiting to hear back  Continue holding other PTA psychotropic medications as encephalopathy continues to improve  Will provide recs regarding reinitiation of medications as patient's encephalopathy resolves  Continue constant observer for acute agitation/responding to internal stimuli  If requiring restraints:  After three (3) hours have lapsed, staff must evaluate for the continued need for physical restraint.  An order to renew or cancel must be given no more than three (3) hours after the initial order.  An in-person evaluation of the restraints must be conducted/documented within one (1) hour.  If patient remains restrained after twenty-four (24) hours a repeat in-person evaluation must be conducted/documented.  The patient must be checked every fifteen (15) minutes for calm, collected behaviors that indicate potential discontinuing of restraint orders.  Patient may not leave AMA  Described a desire to be dead today and asked psych consult team if they could kill her  Continue other psychotropic medications as above  Psychiatric barriers to discharge: resolution of suicidality, medication re-initiation plan  Psychiatry will continue to follow      Seen and discussed with: Dr. Ana    Please feel free to contact us  with any additional questions or concerns by paging the consult team between 8am and 5pm on weekdays and between 8am and 3pm on weekends at [phone]. Otherwise, page the psychiatry resident on call.    --------------------------------------------------------------------------------------------------------------------------------  Subjective:  Sandra Rios was seen for follow up today.    Notable events since last seen: patient refused multiple nighttime medications and received IM haldol  for agitation at 2039.     Today, patient appears much better than she did yesterday. She is not overtly responding to internal stimuli and is able to mostly meaningfully participate in interview. She is uncertain as to how she got to the hospital but notes that it was because she was having trouble breathing. She denies a history of bipolar disorder and states instead she struggles with schizophrenia. She however, is unable to describe much else about her psychiatric history including what symptoms she struggles with that led her to be on medications, her own past psych history and her medications. She believes she recently got her Invega injection but is uncertain as to when that was. She interrupted the conversation a few times to assert that she was smelling something burning. Nasal cannula was switched out by nursing which appeared to resolve the smell issue and patient did not display any signs of any acute neurological changes during the following 10 minutes of interview.    Asked about  if she was seeing or hearing things like hallucinations and she pointed at her CO who is sitting just outside her room at a computer station, watching her through the window. Asked about feelings of depression and initially she denied. However, she then asked the team, could you kill me? and reported her depression was high here in the hospital. She contradicts herself again stating she has depression at home and then only in the hospital but does plainly express that she has a desire to be dead today.    On direct questioning today, patient:    - endorses SI w/ desire to be dead but no plan/no intent.   - denies HI/plan/intent.   - denies AVH. is not observed responding to internal stimuli.    Review of Systems   Constitutional:  Positive for malaise/fatigue.   HENT: Negative.     Eyes: Negative.    Respiratory: Negative.     Cardiovascular: Negative.    Gastrointestinal: Negative.    Genitourinary: Negative.    Neurological: Negative.    Psychiatric/Behavioral:  Positive for suicidal ideas. Negative for depression, hallucinations and substance abuse. The patient is not nervous/anxious.       Objective:                   Vital Signs:  Current                Vital Signs: 24 Hour Range   BP: 106/63 (12/18 0700)  Temp: 37 ?C (98.6 ?F) (12/18 0400)  Pulse: 83 (12/18 0700)  Respirations: 20 PER MINUTE (12/18 0700)  SpO2: 95 % (12/18 0700)  O2%: 70 % (12/18 0700)  O2 Device: CPAP/BiPAP (12/18 0700)  O2 Liter Flow: 10 Lpm (12/17 2300) BP: (101-159)/(58-88)   Temp:  [36.8 ?C (98.3 ?F)-37.4 ?C (99.3 ?F)]   Pulse:  [77-108]   Respirations:  [12 PER MINUTE-27 PER MINUTE]   SpO2:  [85 %-97 %]   O2%:  [70 %]   O2 Device: CPAP/BiPAP  O2 Liter Flow: 10 Lpm    Intensity Pain Scale (Self Report): (not recorded)      Scheduled Medications:  albuterol -ipratropium (DUONEB) nebulizer solution 3 mL, 3 mL, Inhalation, Q6H & PRN  enoxaparin  (LOVENOX ) syringe 40 mg, 40 mg, Subcutaneous, BID  levothyroxine  (SYNTHROID ) tablet 125 mcg, 125 mcg, Oral, QDAY  metoprolol  tartrate tablet 25 mg, 25 mg, Oral, BID  piperacillin /tazobactam (ZOSYN ) 4.5 g in sodium chloride  0.9% (NS) 100 mL IVPB (MB+), 4.5 g, Intravenous, Q6H*  polyethylene glycol 3350  (MIRALAX ) packet 17 g, 1 packet, Oral, BID  QUEtiapine  (SEROquel ) tablet 50 mg, 50 mg, Oral, BID  rosuvastatin  (CRESTOR ) tablet 20 mg, 20 mg, Oral, QDAY        PRN Medications:  acetaminophen  Q6H PRN, haloperidoL  Q6H PRN **OR** haloperidol  lactate Q6H PRN 5 mg at 12/11/24 2039, labetalol  Q4H PRN      Mental Status Exam:  General/Constitutional: patient appears somewhat confused but not in any acute distress today; dressed in hospital attire, no restraints or soft mittens are in place today; patient had O2 nasal canula in place; she is calm and cooperative today  Speech: regular rate, rhythm, and volume; somewhat definitive/agitated tone; fair articulation  Motor: No evidence of PMA/PMR, abnormal/involuntary movements  Mood/Affect: fine/restricted  Thought Process: mostly linear and goal oriented   Associations: answers questions appropriately  Thought Content: denies SI/HI  Perception: denies AVH. Not overtly responding to internal stimuli today  Insight/Judgment: poor/poor    Orientation: A&O x 3-4 (  situation is described as I was having trouble breathing and then I couldn't breathe)  Recent and Remote Memory: impacted, uncertain to the extent  Attention span and concentration: impacted; patient will interrupt interviewers frequently  Language: fluent english speaker  Fund of knowledge and vocabulary: appears below average per conversation    Focused Physical Exam:  Musculoskeletal: moves extremities spontaneously  Neuro: no evidence of echolalia today  _____________________________________________________     Richerd Na, MD  Psychiatry Resident, PGY-2  This note was in part completed using Dragon, a voice recognition software. Some grammatical errors may have occurred. If you have concerns, please contact the dino of this note for clarification.

## 2024-12-12 NOTE — Progress Notes [1]
 Chaplain Note:    Admit Date: 12/11/2024     Pt. On bipap. Resting today. Chaplain remains available as needed.    The On-Call Chaplain is available on Voalte or can be paged via the switchboard [phone]) for urgent and emergent needs.   The Spiritual Care team responds to other requests within 24-hours when submitted as a Chaplain Consult in O2.         Date/Time:                      User:                                      12/12/2024 9:36 AM Morene Eth D.MIN, [facility]

## 2024-12-12 NOTE — Progress Notes [1]
 OCCUPATIONAL THERAPY  ASSESSMENT NOTE      Name: Sandra Rios   MRN: [id number]     DOB: 1981/06/08      Age: 43 y.o.  Admission Date: 12/11/2024     LOS: 1 day     Date of Service: 12/12/2024      Mobility  Patient Turn/Position: Self  Mobility Level [facility] Highest Level of Mobility (JH-HLM): Walk 10 steps or more (to the bathroom)  Distance Walked (feet): 6 ft  Level of Assistance: Assist X1  Assistive Device: Walker  Activity Limited By: Lerry / [employer] Devices;Weakness    Subjective  Reason for Admission and Past Medical Hx: Sandra Rios is a 43 y.o. female w/ PMH of bipolar disorder, chronic pain syndrome, current tobacco use, COPD, chronic hypoxic respiratory failure on 2-3L NC at baseline, morbid obesity, hypothyroidism, & SLE. Several recent admits to OSH this year for hypoxic respiratory failure; most recently 12/5 - 12/10 requiring NIV & treated for pna. Presented to [facility] 12/16 w/ loss of consciousness following ground level fall with head strike. Trauma workup negative. ABG w/ acute on chronic hypoxia and hypercapnia so placed on NIPPV. Imaging w/ worsening multifocal pneumonia. Also noted to have elevated carboxyhemoglobin level. Transferred to [facility] 12/17 for further management d/t persistent hypercapnia.  Special Considerations: Current oxygen requirement  Comments: 6L O2 - wears 3L 24/7 at home  Mental / Cognitive: Alert;Cooperative;Follows commands  Pain: No complaint of pain  Persons Present: Physical Therapist    Home Living Situation  Lives With: Family  Comments: mother  Type of Home: House  Comments: CM notes indicate there are stairs to enter but does not state how many. Patient reports there are no stairs.  In-Home Stairs: Able to live on main level  Bathroom Setup: Tub/shower unit  Comments: Patient is currently a questionable historian. She reports she lives alone and that her mother used to live with her. Patient is unable to recall most details about home set up.    Prior Level of Function  Level Of Independence: Assist needed for mobility related ADL;Assist needed for IADL  Comments: per CM notes - patient reports she is normally independent  History of Falls in Past 3 Months: Yes  Comments: Patient reports 2-3 falls but cannot recall what caused these falls.    ADL's  Where Assessed: Commode  UE Dressing Assist: Minimal Assist  UE Dressing Deficits: Thread LUE;Thread RUE;Fasteners  Toileting Assist: Stand By Assist  Toileting Deficits: Perineal Hygiene  Comment: Assist to donn clean gown primarily due to multiple lines. Asked patient to attempt donning/doffing socks but she declined. Patient was able to complete peri care while seated on toilet with SBA.    ADL Mobility  Bed Mobility: Supine to Sit: Standby assist  Bed Mobility: Sit to Supine: Standby assist  Transfer Type: Sit to/from stand  Transfer: Assistance Level: To/from;Bed;Standby assist  Transfer: Assistive Device: Agricultural Consultant: Type of Assistance: For balance;For safety considerations  Other Transfer Type: Sit to/from stand  Other Transfer: Assistance Level: To/from;Toilet;Standby assist  Other Transfer: Assistive Device: Roller walker  Other Transfer: Type of Assistance: For balance;For safety considerations  End of Activity Status: In bed;Instructed patient to request assist with mobility;Instructed patient to use call light  Gait Distance: 6 feet  Gait: Assistance Level: Minimal assist;Management of lines;Safety considerations  Gait: Assistive Device: Roller walker  Gait Comments: Ambulates to and from the toilet with RW and minA primarily for line management.  Activity Tolerance  Endurance: 3/5 Tolerates 25-30 Minutes Exercise w/Multiple Rests  Comment: VSS throughout session. Patient reports no dizziness/lightheadedness or SOA.    Cognition  Overall Cognitive Status: Confused    ROM  R UE ROM: WFL   R UE ROM Method: Active  L UE ROM: WFL   L UE ROM Method: Active  Grasp: Bilateral Grasp Functional for Activity    Strength / Tone  Strength Position Assessed: Seated  R UE Strength: WFL  L UE Strength: WFL    Education  Persons Educated: Patient  Barriers To Learning: Cognitive Deficits  Teaching Methods: Verbal Instruction  Patient Response: Verbalized Understanding  Topics: Role of OT, Goals for Therapy  Goal Formulation: With Patient    Assessment  Assessment: Decreased ADL Status;Decreased Safe/Judg during ADL;Decreased Cognition;Decreased Endurance;Decreased Self-Care Trans;Decreased High-Level ADLs  Prognosis: Good  Goal Formulation: Patient  Comments: OT will continue to follow during this admission to assist with improving strength and endurance needed for more independent ADL and functional mobility participation.    AM-PAC 6 Clicks Daily Activity Inpatient  Putting on and taking off regular lower body clothes: A Lot  Bathing (Including washing, rinsing, drying): A Little  Toileting, which includes using toilet, bedpan, or urinal: A Little  Putting on and taking off regular upper body clothing: A Little  Taking care of personal grooming such as brushing teeth: None  Eating meals: None  Daily Activity Raw Score: 19  Standardized (T-scale) Score: 40.22    Plan  OT Frequency: 3-5x/week  OT Plan for Next Visit: grooming at sink; LB dressing; progress mobility    ADL Goals  Patient Will Perform All ADL's: w/ Stand By Assist    Functional Transfer Goals  Pt Will Perform All Functional Transfers: Modified Independent, w/ Assistive Devices    OT Discharge Recommendations  Recommendation: Anticipate home with assistance at time of discharge      Therapist: Rocky Slain, MOTR/L  Date: 12/12/2024

## 2024-12-13 MED ORDER — CYCLOBENZAPRINE 10 MG PO TAB
10 mg | Freq: Three times a day (TID) | ORAL | 0 refills | Status: DC | PRN
Start: 2024-12-13 — End: 2024-12-16
  Administered 2024-12-13 – 2024-12-16 (×9): 10 mg via ORAL

## 2024-12-13 MED ORDER — PREGABALIN 75 MG PO CAP
150 mg | Freq: Two times a day (BID) | ORAL | 0 refills | Status: DC
Start: 2024-12-13 — End: 2024-12-16
  Administered 2024-12-13 – 2024-12-16 (×7): 150 mg via ORAL

## 2024-12-13 MED ORDER — NYSTATIN 100,000 UNIT/GRAM TP POWD
Freq: Every day | TOPICAL | 0 refills | Status: DC
Start: 2024-12-13 — End: 2024-12-16
  Administered 2024-12-13: 19:00:00 via TOPICAL

## 2024-12-13 MED ORDER — RISPERIDONE 1 MG PO TAB
1 mg | Freq: Every evening | ORAL | 0 refills | Status: DC | PRN
Start: 2024-12-13 — End: 2024-12-16

## 2024-12-13 MED ORDER — IPRATROPIUM-ALBUTEROL 0.5 MG-3 MG(2.5 MG BASE)/3 ML IN NEBU
3 mL | RESPIRATORY_TRACT | 0 refills | Status: DC | PRN
Start: 2024-12-13 — End: 2024-12-16

## 2024-12-13 MED ORDER — POTASSIUM CHLORIDE 20 MEQ PO TBTQ
40 meq | Freq: Once | ORAL | 0 refills | Status: CP
Start: 2024-12-13 — End: ?
  Administered 2024-12-13: 11:00:00 40 meq via ORAL

## 2024-12-13 MED ORDER — DOXEPIN 25 MG PO CAP
100 mg | Freq: Two times a day (BID) | ORAL | 0 refills | Status: DC
Start: 2024-12-13 — End: 2024-12-16
  Administered 2024-12-13 – 2024-12-16 (×7): 100 mg via ORAL

## 2024-12-13 MED ORDER — TRAZODONE 50 MG PO TAB
100 mg | Freq: Every evening | ORAL | 0 refills | Status: DC
Start: 2024-12-13 — End: 2024-12-16
  Administered 2024-12-14 – 2024-12-16 (×3): 100 mg via ORAL

## 2024-12-13 NOTE — Progress Notes [1]
 OCCUPATIONAL THERAPY  PROGRESS NOTE      Name: Sandra Rios   MRN: [id number]     DOB: 11-06-1981      Age: 43 y.o.  Admission Date: 12/11/2024     LOS: 2 days     Date of Service: 12/13/2024      Mobility  Patient Turn/Position: Self  Mobility Level [facility] Highest Level of Mobility (JH-HLM): Walk 250 feet or more  Distance Walked (feet): 250 ft  Level of Assistance: Assist X1  Assistive Device: Walker  Activity Limited By: Lerry / Medical Devices;Weakness    Subjective  Reason for Admission and Past Medical Hx: Sandra Rios is a 43 y.o. female w/ PMH of bipolar disorder, chronic pain syndrome, current tobacco use, COPD, chronic hypoxic respiratory failure on 2-3L NC at baseline, morbid obesity, hypothyroidism, & SLE. Several recent admits to OSH this year for hypoxic respiratory failure; most recently 12/5 - 12/10 requiring NIV & treated for pna. Presented to [facility] 12/16 w/ loss of consciousness following ground level fall with head strike. Trauma workup negative. ABG w/ acute on chronic hypoxia and hypercapnia so placed on NIPPV. Imaging w/ worsening multifocal pneumonia. Also noted to have elevated carboxyhemoglobin level. Transferred to [facility] 12/17 for further management d/t persistent hypercapnia.  Special Considerations: Current oxygen requirement  Comments: 5L O2 at rest; 6L O2 with activity  Mental / Cognitive: Alert;Cooperative;Follows commands  Pain: No complaint of pain  Persons Present: Constant observer    Home Living Situation  Lives With: Family  Comments: Mother  Type of Home: House  Comments: CM notes indicate there are stairs to enter but does not state how many. Patient reports there are no stairs.  In-Home Stairs: Able to live on main level  Bathroom Setup: Tub/shower unit  Comments: Patient is currently a questionable historian. She reports she lives alone and that her mother used to live with her. Patient is unable to recall most details about home set up.    Prior Level of Function  Level Of Independence: Assist needed for mobility related ADL;Assist needed for IADL  Comments: per CM notes - patient reports she is normally independent  History of Falls in Past 3 Months: Yes  Comments: Patient reports 2-3 falls but cannot recall what caused these falls.    ADL's  Where Assessed: Edge of Bed  UE Dressing Assist: Stand By Assist  UE Dressing Deficits: Thread RUE;Thread LUE;Pull Around Back  Comment: Patient donned extra gown as a robe before ambulating while standing at EOB    ADL Mobility  Bed Mobility: Supine to Sit: Modified independent  Bed Mobility: Sit to Supine: Modified independent  Transfer Type: Sit to/from stand  Transfer: Assistance Level: To/from;Bed;Standby assist  Transfer: Assistive Device: Agricultural Consultant: Type of Assistance: For safety considerations  End of Activity Status: In bed;Instructed patient to request assist with mobility;Instructed patient to use call light  Gait Distance: 250 feet  Gait: Assistance Level: Minimal assist;Management of Rios (CGA)  Gait: Assistive Device: Roller walker  Gait Comments: Ambulates lap on unit with RW and CGA for balance and safety. Reports mild fatigue after mobility.    Activity Tolerance  Endurance: 2/5 Tolerates 10-20 Minutes Exercise w/Multiple Rests  Comment: SpO2 on 6L at 89% after completing mobility. Able to recover to 90-92% with rest break within less than 1 minute.    Assessment  Assessment: Decreased ADL Status;Decreased Safe/Judg during ADL;Decreased Cognition;Decreased Endurance;Decreased Self-Care Trans;Decreased High-Level ADLs  Prognosis: Good  AM-PAC 6 Clicks Daily Activity Inpatient  Putting on and taking off regular lower body clothes: A Lot  Bathing (Including washing, rinsing, drying): A Little  Toileting, which includes using toilet, bedpan, or urinal: None  Putting on and taking off regular upper body clothing: None  Taking care of personal grooming such as brushing teeth: None  Eating meals: None  Daily Activity Raw Score: 21  Standardized (T-scale) Score: 44.27    Plan  Progress: Progressing Toward Goals  OT Frequency: 3-5x/week  OT Plan for Next Visit: LB dressing; endurance tasks    ADL Goals  Patient Will Perform All ADL's: w/ Stand By Assist    Functional Transfer Goals  Pt Will Perform All Functional Transfers: Modified Independent, w/ Assistive Devices    OT Discharge Recommendations  Recommendation: Home with intermittent supervision/assistance      Therapist: Rocky Slain, MOTR/L  Date: 12/13/2024

## 2024-12-13 NOTE — Progress Notes [1]
 1900: Report received from off going RN, Bedside safe check preformed. All gtts verified per [facility]. Plan of care and orders received.     2000: Assessment complete at this time. Please see ICU flowsheet. VSS per patient trends.    2222: Attempted to call report to nurse receiving patient on unit HC8. Nurse requested to be called back in ten minutes.    2247: Report given to RN receiving patient on HC8.

## 2024-12-13 NOTE — Progress Notes [1]
 Critical Care Progress Note    Sandra Rios  Today's Date:  12/13/2024  Admission Date: 12/11/2024  LOS: 2 days    Principal Problem:    Acute on chronic respiratory failure with hypoxia and hypercapnia (CMS-HCC)  Active Problems:    Carboxyhemoglobinemia    Multifocal pneumonia    On mechanically assisted ventilation (CMS-HCC)    Sepsis, unspecified organism (CMS-HCC)    Acute metabolic encephalopathy    Ground-level fall    Chronic pain syndrome    Brief Hospital Course:      Sandra Rios is a 43 y.o. female w/ PMH of bipolar disorder, chronic pain syndrome, current tobacco use, COPD, chronic hypoxic respiratory failure on 2-3L NC at baseline, morbid obesity, hypothyroidism, & SLE. Several recent admits to OSH this year for hypoxic respiratory failure; most recently 12/5 - 12/10 requiring NIV & treated for pna.    Presented to [facility] 12/16 w/ loss of consciousness following ground level fall with head strike. Trauma workup negative. ABG w/ acute on chronic hypoxia and hypercapnia so placed on NIPPV. Imaging w/ worsening multifocal pneumonia. Also noted to have elevated carboxyhemoglobin level. Transferred to [facility] 12/17 for further management d/t persistent hypercapnia.    Continued to be lethargic w/ hypercapnia despite maximizing NIV settings. S/p Narcan  w/ improvement so started on narcan  gtt; transitioned off NIV. Hyperbaric medicine consulted but pt not candidate for therapy given NIV requirement & improving carboxyhemoglobin level.     Narcan  gtt later stopped d/t intermittent agitation; required Precedex  gtt for agitation, now off.   Psych consulted given significant Psych hx & hx severe agitation; have slowly resumed PTA meds.  Infectious w/u in process; plan for ~5d course abx for CAP coverage.  Appropriate for transfer to floor.     Assessment/Plan:      NEURO  Acute Metabolic Encephalopathy  Fall  Bipolar Disorder  Anxiety/Depression  Chronic Pain  - Encephalopathy likely 2/2 profound hypercapnia  - PTA cyclobenzaprine  10 mg TID PRN, doxepin  100 mg BID, hydrocodone -acetaminophen  5/325 mg TID PRN, ibuprofen 200 mg q6 PRN, Lyrica  300 mg BID, Seroquel  50 mg BID, trazodone  150 mg qHS PRN, invega Sustenna 234mg  q30 days  - Has had agitation requiring psychiatry consult during past inpatient admissions at [facility]  - Presented to OSH 12/16 after witnessed ground level fall at home; trauma workup negative at OSH ED  - OSH CT Head 12/16: No acute intracranial findings. No panenchymal changes to indicate carbon monoxide poisoning.  - OSH CT C-Spine w/o 12/16: No acute osseous injury of the cervical spine.  - OSH ETOH 12/16: < 10  - OSH UDS 12/16: + TCA, THC, opiates (expected w/ PTA meds)  - On admit to MICU 12/17 was lethargic but arousable -> administered Narcan  w/ improvement so started gtt  - Later w/ intermittent agitation -> Narcan  gtt stopped, low dose PTA Seroquel  resumed, & Psych consulted  - Required addition of Precedex  12/17 PM; off as of 12/19 AM   PLAN  - Dc Precedex   - Psych following         - Seroquel  50mg  BID (currently refusing)         - Haldol  5mg  Q6 PRN agitation (1x dose last 24h)         - Continue PTA Norco          - Resume PTA Lyrica  at half dose, PTA doxepin , & PTA cyclobenzaprine          - Hold remaining PTA  meds  - CO at bedside -- pt reported SI to Psych on 12/18     PULM  Acute on Chronic Respiratory Failure with Hypoxia and Hypercapnia  Multifocal Pneumonia  Carboxyhemoglobinemia  COPD  OSA/OHS?  - Unclear cause of acute on chronic respiratory failure; ddx COPD exacerbation vs worsening of pneumonia vs ?aspiration in the setting of LOC/altered mental status  - PTA Duoneb TID  - Pt reports baseline 3L   - [facility] admit 07/2024 - 08/2024 - required intubation & proning for ARDS; [facility] admit 11/29/24 - 12/04/24 with pneumonia, developed hypoxia & hypercapnia requiring NIPPV  - Presented to OSH 12/16 after fall as above; found to be hypoxic & hypercapnic -> NIV  - OSH carboxyhemoglobin: 21.2 -> 16.7 (reportedly family did not allow EMS to check carbon monoxide levels in the house); level at [facility] improved, down to 7.7  - OSH CTA Chest 12/16: No PE. Patchy linear consolidation throughout the lungs, similar to the previous exam. Left lower lobe opacification has mildly progressed since the previous exam. Findings most compatible with multifocal pneumonia.  - Continued on NIV upon admit w/ MICU w/ worsening hypercapnia despite maximizing settings (7.18 / 117) -> given narcan  as above w/ improvement  - Evaluated by Hyperbaric medicine earlier this admit & was deemed not a candidate d/t NIV requirement  - I/O: net - 1.8L   - Transitioned off NIV 12/17 though wore overnight  - Currently 5L NC  PLAN  - Duonebs Q6 & PRN  - Continue prednisone  40mg  daily x5d thru 12/22  - Rec repeat CT in 4-6 weeks   - ID as below     CV  Tachycardia  HTN  HLD  - PTA furosemide  40 mg daily, metoprolol  tartrate 50 mg BID, rosuvastatin  20 mg qHS, spironolactone  50 mg daily  - ECG 12/16: Sinus tach, HR 110, QTC 452  - HR 70s, SBP 110s-120s  PLAN  - Continue PTA metoprolol  at decreased dose 25mg  BID  - Continue PTA furosemide , spironolactone , rosuvastatin    - Labetalol  PRN for SBP > 160     GI  Morbid Obesity  - Last BM ~12/18  - Regular diet  - Miralax  daily     RENAL  - Cr 0.60, BUN 16, CO2 34  - I/O as above   - CMP, mg, phos daily      ENDO  Hypothyroidism  - Labs from 12/17: TSH 5.24, free T4 WNL  - Continue PTA levothyroxine  125 mcg daily     ID  Leukocytosis  Multifocal Pneumonia  Sepsis  Hx Stenotrophomonas Pneumonia (September 2025)  - Recent admits to OSH w/ pneumonia; treated for steno PNA September 2025 w/ levaquin, recently treated again for PNA w/ levaquin (finished course 12/09/24)  - Endorses that her mother has been sick with URI, denies infectious prodrome  - Procal 0.08  - Urine cx 12/17: negative   - MRSA PCR, RVP 12/17: negative  - Blood cx 12/17: NGTD  - WBC 17, afebrile  PLAN  - Continue empiric Zosyn  (12/17 --  ), anticipate 5d course  - Sputum cx ordered, pending collection      HEME  - Hgb 15, plt 533  - Daily CBC  - Lovenox  ppx     PPX:  Lines: PIVs  Drains/Tubes: foley -- remove foley  DVT: Lovenox   GI: not indicated     Code Status: Full  Disposition: Appropriate for transfer to floor      Kreg Fruits, APRN, ACAGNP-C  Pulm/Critical Care  Available on [employer]  M2 Pager [phone]  12/13/2024 1253 time  __________________________________________________________________________________    Subjective:     Sandra Rios is a 43 y.o. female who is awake & resting in bed. Reports no issues overnight. She is feeling relatively well this morning. She is having some back pain & asking about her PTA cyclobenzaprine . Would also like to resume PTA doxepin  if able. Denies any feelings of SI this AM; also denies reporting SI to Psych yesterday.     Objective:     Medications:  Scheduled Meds:albuterol -ipratropium (DUONEB) nebulizer solution 3 mL, 3 mL, Inhalation, Q6H & PRN  enoxaparin  (LOVENOX ) syringe 40 mg, 40 mg, Subcutaneous, BID  furosemide  (LASIX ) tablet 40 mg, 40 mg, Oral, QDAY  levothyroxine  (SYNTHROID ) tablet 125 mcg, 125 mcg, Oral, QDAY  metoprolol  tartrate tablet 25 mg, 25 mg, Oral, BID  piperacillin /tazobactam (ZOSYN ) 4.5 g in sodium chloride  0.9% (NS) 100 mL IVPB (MB+), 4.5 g, Intravenous, Q6H*  polyethylene glycol 3350  (MIRALAX ) packet 17 g, 1 packet, Oral, BID  predniSONE  (DELTASONE ) tablet 40 mg, 40 mg, Oral, QDAY  QUEtiapine  (SEROquel ) tablet 50 mg, 50 mg, Oral, BID  rosuvastatin  (CRESTOR ) tablet 20 mg, 20 mg, Oral, QDAY  spironolactone  (ALDACTONE ) tablet 50 mg, 50 mg, Oral, QDAY    Continuous Infusions:   [Held by Provider] dexMEDEtomidine  (PRECEDEX ) 400 mcg/NS 100 ml IV drip (premade) Stopped (12/13/24 0500)    sodium chloride  0.9% TKO infusion 5 mL/hr at 12/11/24 0638     PRN and Respiratory Meds:acetaminophen  Q6H PRN, haloperidoL  Q6H PRN **OR** haloperidol  lactate Q6H PRN, HYDROcodone /acetaminophen  Q8H PRN, labetalol  Q4H PRN                       Vital Signs: Last Filed                  Vital Signs: 24 Hour Range   BP: 115/76 (12/19 0700)  Temp: 36.9 ?C (98.4 ?F) (12/19 0400)  Pulse: 77 (12/19 0700)  Respirations: 24 PER MINUTE (12/18 2106)  SpO2: 92 % (12/19 0700)  O2%: 60 % (12/18 1100)  O2 Device: High flow nasal cannula (12/19 0700)  O2 Liter Flow: 7 Lpm (12/19 0700) BP: (110-131)/(62-87)   Temp:  [36.1 ?C (97 ?F)-37 ?C (98.6 ?F)]   Pulse:  [69-104]   Respirations:  [13 PER MINUTE-28 PER MINUTE]   SpO2:  [89 %-98 %]   O2%:  [60 %-70 %]   O2 Device: High flow nasal cannula  O2 Liter Flow: 7 Lpm    Intensity Pain Scale (Self Report): 8 (12/13/24 0000) Vitals:    12/11/24 0500 12/12/24 0500 12/13/24 0500   Weight: 113.9 kg (251 lb 1.7 oz) 115.7 kg (255 lb 1.2 oz) 112.6 kg (248 lb 3.8 oz)           Intake/Output Summary:  (Last 24 hours)    Intake/Output Summary (Last 24 hours) at 12/13/2024 9278  Last data filed at 12/13/2024 0600  Gross per 24 hour   Intake 1249.15 ml   Output 3085 ml   Net -1835.85 ml         Physical Exam:      General: calm, no apparent distress  HEENT: normocephalic, atraumatic  Lungs: non-labored  Heart: S1, S2; RRR  Abdomen: obese, + bowel sounds, non-distended  Extremities: trace BLE edema  Skin: warm, dry; no rashes or lesions to exposed skin  Neurologic: alert, calm    Artificial airway:  None  Ventilator/ Respiratory Therapy:  No     Vent weaning trial:  Not applicable      Laboratory:  LABS:  Recent Labs     12/11/24  0024 12/11/24  0215 12/11/24  0541 12/11/24  1447 12/12/24  0354 12/13/24  0314   NA 141 142 143 140 142 142   K 4.6 4.7 4.2 3.6 3.7 3.2*   CL 98 97* 92* 92* 95* 98   CO2 34* 37* 42* 39* 35* 34*   GAP 9 8 9 9 12 10    BUN 10 10 9 10 15 16    CR 0.58 0.63 0.76 0.73 0.69 0.60   GLU 106* 115* 144* 108* 101* 107*   CA 9.0 9.2 9.2 9.3 9.3 9.3   ALBUMIN 4.0 4.1  --   --  4.0 4.1   MG 2.1 2.0 1.8  --  2.1 2.1   PO4 4.1 4.4  --   --  2.9 2.4   TSH 5.24*  --   --   --   --   --         Recent Labs     12/11/24  0024 12/11/24  0215 12/12/24  0354 12/13/24  0314   WBC 18.70* 15.20* 16.00* 17.00*   HGB 14.7 14.2 14.5 15.0   HCT 44.8 45.1* 43.7 45.6*   PLTCT 467* 481* 509* 533*   PT  --  11.2  --   --    INR  --  1.0  --   --    PTT  --  32.0  --   --    AST 13 15 14 12    ALT 9 10 8 10    ALKPHOS 87 91 80 71      Estimated Creatinine Clearance: 129.5 mL/min (by C-G formula based on SCr of 0.6 mg/dL).  Vitals:    12/11/24 0500 12/12/24 0500 12/13/24 0500   Weight: 113.9 kg (251 lb 1.7 oz) 115.7 kg (255 lb 1.2 oz) 112.6 kg (248 lb 3.8 oz)      Recent Labs     12/11/24  0324 12/12/24  0413   PHART 7.18* 7.44   PO2ART 136* 73*                 Radiology and Other Diagnostic Procedures Review:    Reviewed pertinent studies.

## 2024-12-13 NOTE — Transfer [600006]
 In-Hospital Transfer Note    Admission Diagnosis:  acute on chronic hypoxic & hypercapnic respiratory failure  Admission Date: 12/11/2024  Active Hospital Problem List:  Principal Problem:    Acute on chronic respiratory failure with hypoxia and hypercapnia (CMS-HCC)  Active Problems:    Carboxyhemoglobinemia    Multifocal pneumonia    On mechanically assisted ventilation (CMS-HCC)    Sepsis, unspecified organism (CMS-HCC)    Acute metabolic encephalopathy    Ground-level fall    Chronic pain syndrome    Hospital Course:    Sandra Rios is a 43 y.o. female w/ PMH of bipolar disorder, chronic pain syndrome, current tobacco use, COPD, chronic hypoxic respiratory failure on 2-3L NC at baseline, morbid obesity, hypothyroidism, & SLE. Several recent admits to OSH this year for hypoxic respiratory failure; most recently 12/5 - 12/10 requiring NIV & treated for pna.    Presented to [facility] 12/16 w/ loss of consciousness following ground level fall with head strike. Trauma workup negative. ABG w/ acute on chronic hypoxia and hypercapnia so placed on NIPPV. Imaging w/ worsening multifocal pneumonia. Also noted to have elevated carboxyhemoglobin level. Transferred to [facility] 12/17 for further management d/t persistent hypercapnia.  Continued to be lethargic w/ hypercapnia despite maximizing NIV settings. S/p Narcan  w/ improvement so started on narcan  gtt; transitioned off NIV. Hyperbaric medicine consulted but pt not candidate for therapy given NIV requirement & improving carboxyhemoglobin level.   Narcan  gtt later stopped d/t intermittent agitation; required Precedex  gtt for agitation, now off.   Psych consulted given significant Psych hx & hx severe agitation; have slowly resumed PTA meds.  Infectious w/u in process; plan for ~5d course abx for CAP coverage.  Significant Medication Information (to include antibiotic duration/indication, anticoagulation and steroids, etc.):   Zosyn  -- plan for 5d course for CAP Prednisone  x5d course for possible COPD exacerbation  Significant PTA neuro/psych meds continued -- Psych following  Lovenox  for DVT ppx  Procedures With Dates:  none  Consults:  Psych  Follow-Up Items:    - rec repeat CT scan in 4-6 weeks to eval for resolution of multifocal pna  - ongoing psych recs   Activity/Weight bearing status:  no restrictions  Nutrition:  regular diet  Discharge Plan:  ongoing    Kreg Fruits, APRN, ACAGNP-C  Pulm/Critical Care  Available on [facility]  M2 Pager [phone]

## 2024-12-13 NOTE — Progress Notes [1]
 PHYSICAL THERAPY  DISCHARGE NOTE          Name: Sandra Rios   MRN: [id number]     DOB: 05-08-1981      Age: 43 y.o.  Admission Date: 12/11/2024     LOS: 2 days     Date of Service: 12/13/2024      Based on discussion with OT, patient's current level of function suggests that patient will progress with one discipline. Physical Therapy will sign off at this time. Please re-consult if a change in functional status occurs.    AM-PAC 6 Clicks Basic Mobility Inpatient  Turning from your back to your side while in a flat bed without using bed rails: None  Moving from lying on your back to sitting on the side of a flat bed without using bedrails : None  Moving to and from a bed to a chair (including a wheelchair): A Little  Standing up from a chair using your arms (e.g. wheelchair, or bedside chair): A Little  To walk in hospital room: A Little  Climbing 3-5 steps with a railing: A Little  Basic Mobility Inpatient Raw Score: 20  Standardized (T-scale) Score: 43.99  Mobility Goal [facility]: JH-HLM 6 Walk >= 10 steps    Goals  Goal Formulation: With Patient  Time For Goal Achievement: 3 days, To, 7 days  Patient Will Go Supine To/From Sit: Discontinued  Patient Will Transfer Bed/Chair: Discontinued  Patient Will Transfer Sit to Stand: Discontinued  Patient Will Ambulate: Discontinued  Patient Will Go Up / Down Stairs: Discontinued    Plan  Plan Frequency: No Further Treatment    PT Discharge Recommendations  Recommendation: Home/prior living situation    Therapist: Damien Bless, PT, DPT  Date: 12/13/2024

## 2024-12-13 NOTE — Progress Notes [1]
~  0700: Bedside safety check complete with Darleene, RN.     ~0800: Initial assessment completed. Oriented x4, RASS 0, follows commands x4, PERRLA 4 mm, on HHFNC 7L. See Doc Flowsheets for details.     1100: Pt downgraded to med/surg status. Foley catheter removed.    ~1600: Focal assessment completed. On HHFNC 5 L. See Doc Flowsheets for details.    1704: Provider contacted regarding pt request to access phone. Provider approved; pt given cell phone and reading glasses from personal belongings bag outside pt room.    1900: Bedside safety check complete with Darleene, RN.

## 2024-12-14 MED ORDER — CALCIUM CARBONATE 200 MG CALCIUM (500 MG) PO CHEW
500 mg | ORAL | 0 refills | Status: DC | PRN
Start: 2024-12-14 — End: 2024-12-16
  Administered 2024-12-14 – 2024-12-15 (×2): 500 mg via ORAL

## 2024-12-14 MED ORDER — POTASSIUM CHLORIDE 20 MEQ PO TBTQ
80 meq | Freq: Once | ORAL | 0 refills | Status: CP
Start: 2024-12-14 — End: ?
  Administered 2024-12-14: 15:00:00 80 meq via ORAL

## 2024-12-14 NOTE — Progress Notes [1]
 Internal Medicine Progress Note    Sandra Rios  Today's Date:  12/14/2024  Admission Date: 12/11/2024  LOS: 3 days    Principal Problem:    Acute on chronic respiratory failure with hypoxia and hypercapnia (CMS-HCC)  Active Problems:    Carboxyhemoglobinemia    Multifocal pneumonia    On mechanically assisted ventilation (CMS-HCC)    Sepsis, unspecified organism (CMS-HCC)    Acute metabolic encephalopathy    Ground-level fall    Chronic pain syndrome    Brief Hospital Course:      Sandra Rios is a 43 y.o. female w/ PMH of bipolar disorder, chronic pain syndrome, current tobacco use, COPD, chronic hypoxic respiratory failure on 2-3L NC at baseline, morbid obesity, hypothyroidism, & SLE. Several recent admits to OSH this year for hypoxic respiratory failure; most recently 12/5 - 12/10 requiring NIV & treated for multifocal PNA.    Presented to [facility] 12/16 w/ loss of consciousness following ground level fall with head strike. Trauma workup negative. ABG w/ acute on chronic hypoxia and hypercapnia so placed on NIPPV. Imaging w/ worsening multifocal pneumonia. Also noted to have elevated carboxyhemoglobin level. Transferred to [facility] 12/17 for further management d/t persistent hypercapnia.    Continued to be lethargic w/ hypercapnia despite maximizing NIV settings. S/p Narcan  w/ improvement so started on narcan  gtt; transitioned off NIV. Hyperbaric medicine consulted but pt not candidate for therapy given NIV requirement & improving carboxyhemoglobin level.     Narcan  gtt later stopped d/t intermittent agitation; required Precedex  gtt for agitation, now off.   Psych consulted given significant Psych hx & hx severe agitation; have slowly resumed PTA meds.  Infectious w/u in process; plan for ~5d course abx w/ zosyn  for CAP coverage.  Appropriate for transfer to floor.     Assessment/Plan:      NEURO  Acute Metabolic Encephalopathy  Fall  Bipolar Disorder  Anxiety/Depression  Chronic Pain  - Encephalopathy likely 2/2 profound hypercapnia  - PTA cyclobenzaprine  10 mg TID PRN, doxepin  100 mg BID, hydrocodone -acetaminophen  5/325 mg TID PRN, ibuprofen 200 mg q6 PRN, Lyrica  300 mg BID, Seroquel  50 mg BID, trazodone  150 mg qHS PRN, invega Sustenna 234mg  q30 days  - Has had agitation requiring psychiatry consult during past inpatient admissions at [facility]  - Presented to OSH 12/16 after witnessed ground level fall at home; trauma workup negative at OSH ED  - OSH CT Head 12/16: No acute intracranial findings. No panenchymal changes to indicate carbon monoxide poisoning.  - OSH CT C-Spine w/o 12/16: No acute osseous injury of the cervical spine.  - OSH ETOH 12/16: < 10  - OSH UDS 12/16: + TCA, THC, opiates (expected w/ PTA meds)  - On admit to MICU 12/17 was lethargic but arousable -> administered Narcan  w/ improvement so started gtt  - Later w/ intermittent agitation -> Narcan  gtt stopped, low dose PTA Seroquel  resumed, & Psych consulted  - Required addition of Precedex  12/17 PM; off as of 12/19 AM   PLAN  - Dc Precedex   - Psych following         - Seroquel  50mg  BID (currently refusing)         - Haldol  5mg  Q6 PRN agitation (1x dose last 24h)         - Continue PTA Norco          - Resume PTA Lyrica  at half dose, PTA doxepin , & PTA cyclobenzaprine          -  Hold remaining PTA meds  - CO at bedside -- pt reported SI to Psych on 12/18  - Feels better, has some chronic back pain     PULM  Acute on Chronic Respiratory Failure with Hypoxia and Hypercapnia  Multifocal Pneumonia  Carboxyhemoglobinemia  COPD  OSA/OHS?  - Unclear cause of acute on chronic respiratory failure; ddx COPD exacerbation vs worsening of pneumonia vs ?aspiration in the setting of LOC/altered mental status  - PTA Duoneb TID  - Pt reports baseline 3L   - [facility] admit 07/2024 - 08/2024 - required intubation & proning for ARDS; [facility] admit 11/29/24 - 12/04/24 with pneumonia, developed hypoxia & hypercapnia requiring NIPPV  - Presented to OSH 12/16 after fall as above; found to be hypoxic & hypercapnic -> NIV  - OSH carboxyhemoglobin: 21.2 -> 16.7 (reportedly family did not allow EMS to check carbon monoxide levels in the house); level at [facility] improved, down to 7.7  - OSH CTA Chest 12/16: No PE. Patchy linear consolidation throughout the lungs, similar to the previous exam. Left lower lobe opacification has mildly progressed since the previous exam. Findings most compatible with multifocal pneumonia.  - Continued on NIV upon admit w/ MICU w/ worsening hypercapnia despite maximizing settings (7.18 / 117) -> given narcan  as above w/ improvement  - Evaluated by Hyperbaric medicine earlier this admit & was deemed not a candidate d/t NIV requirement  - I/O: net - 1.8L   - Transitioned off NIV 12/17 though wore overnight  - Currently 5L NC  PLAN  - Duonebs Q6 & PRN  - Continue prednisone  40mg  daily x5d thru 12/22  - Rec repeat CT in 4-6 weeks   - ID as below     CV  Tachycardia  HTN  HLD  - PTA furosemide  40 mg daily, metoprolol  tartrate 50 mg BID, rosuvastatin  20 mg qHS, spironolactone  50 mg daily  - ECG 12/16: Sinus tach, HR 110, QTC 452  - HR 70s, SBP 110s-120s  PLAN  - Continue PTA metoprolol  at decreased dose 25mg  BID  - Continue PTA furosemide , spironolactone , rosuvastatin    - Labetalol  PRN for SBP > 160     GI  Morbid Obesity  - Last BM ~12/18  - Regular diet  - Miralax  daily     RENAL  - Cr 0.60, BUN 16, CO2 34  - I/O as above   - CMP, mg, phos daily   - Low K+ noted 12/20, repleted per protocol     ENDO  Hypothyroidism  - Labs from 12/17: TSH 5.24, free T4 WNL  - Continue PTA levothyroxine  125 mcg daily     ID  Leukocytosis  Multifocal Pneumonia  Sepsis  Hx Stenotrophomonas Pneumonia (September 2025)  - Recent admits to OSH w/ pneumonia; treated for steno PNA September 2025 w/ levaquin, recently treated again for PNA w/ levaquin (finished course 12/09/24)  - Endorses that her mother has been sick with URI, denies infectious prodrome  - Procal 0.08  - Urine cx 12/17: negative   - MRSA PCR, RVP 12/17: negative  - Blood cx 12/17: NGTD  - WBC 17, afebrile  PLAN  - Continue empiric Zosyn  (Day 4), anticipate 5d course  - Sputum cx ordered, pending collection   - WBC gradually uptrending, 20.60 today. Continue monitoring, and correlate with clinical improvement. Expand Abx coverage if needed, based on CXR findings.      HEME  - Hgb 15, plt 533  - Daily CBC  - Lovenox  ppx  PPX:  Lines: PIVs  Drains/Tubes: foley -- remove foley  DVT: Lovenox   GI: not indicated     Code Status: Full  Disposition: Discharge pending clinical improvement    Patient seen and discussed with Dr Ivie    Dr Marvella Pottier, M.B.B.S.  PGY-1, Internal Medicine     __________________________________________________________________________________    Subjective:     Sandra Rios is a 43 y.o. female who is awake & resting in bed. Reports no issues overnight. She is feeling relatively well this morning. She is having some back pain and feeling better with her mood. No other complaints.     Objective:     Medications:  Scheduled Meds:doxepin  (SINEquan ) capsule 100 mg, 100 mg, Oral, BID  enoxaparin  (LOVENOX ) syringe 40 mg, 40 mg, Subcutaneous, BID  furosemide  (LASIX ) tablet 40 mg, 40 mg, Oral, QDAY  levothyroxine  (SYNTHROID ) tablet 125 mcg, 125 mcg, Oral, QDAY  metoprolol  tartrate tablet 25 mg, 25 mg, Oral, BID  nystatin  (NYSTOP ) topical powder, , Topical, QDAY  piperacillin /tazobactam (ZOSYN ) 4.5 g in sodium chloride  0.9% (NS) 100 mL IVPB (MB+), 4.5 g, Intravenous, Q6H*  polyethylene glycol 3350  (MIRALAX ) packet 17 g, 1 packet, Oral, BID  predniSONE  (DELTASONE ) tablet 40 mg, 40 mg, Oral, QDAY  pregabalin  (LYRICA ) capsule 150 mg, 150 mg, Oral, BID  QUEtiapine  (SEROquel ) tablet 50 mg, 50 mg, Oral, BID  rosuvastatin  (CRESTOR ) tablet 20 mg, 20 mg, Oral, QDAY  spironolactone  (ALDACTONE ) tablet 50 mg, 50 mg, Oral, QDAY  traZODone  (DESYREL ) tablet 100 mg, 100 mg, Oral, QHS    Continuous Infusions:   sodium chloride  0.9% TKO infusion 5 mL/hr at 12/14/24 0151     PRN and Respiratory Meds:acetaminophen  Q6H PRN, albuterol -ipratropium Q6H PRN, cyclobenzaprine  TID PRN, haloperidoL  Q6H PRN **OR** haloperidol  lactate Q6H PRN, HYDROcodone /acetaminophen  Q8H PRN, labetalol  Q4H PRN, risperiDONE  QHS PRN                       Vital Signs: Last Filed                  Vital Signs: 24 Hour Range   BP: 117/69 (12/20 0451)  Temp: 36.7 ?C (98 ?F) (12/20 0451)  Pulse: 86 (12/20 0451)  Respirations: 22 PER MINUTE (12/20 0451)  SpO2: 96 % (12/20 0451)  O2 Device: High flow nasal cannula (12/20 0451)  O2 Liter Flow: 5 Lpm (12/20 0451) BP: (101-141)/(58-90)   Temp:  [36.2 ?C (97.2 ?F)-37.2 ?C (98.9 ?F)]   Pulse:  [77-114]   Respirations:  [17 PER MINUTE-27 PER MINUTE]   SpO2:  [92 %-96 %]   O2 Device: High flow nasal cannula  O2 Liter Flow: 5 Lpm    Intensity Pain Scale (Self Report): 8 (12/14/24 0102) Vitals:    12/11/24 0500 12/12/24 0500 12/13/24 0500   Weight: 113.9 kg (251 lb 1.7 oz) 115.7 kg (255 lb 1.2 oz) 112.6 kg (248 lb 3.8 oz)           Intake/Output Summary:  (Last 24 hours)    Intake/Output Summary (Last 24 hours) at 12/14/2024 9355  Last data filed at 12/13/2024 2210  Gross per 24 hour   Intake 2217 ml   Output 1415 ml   Net 802 ml         Physical Exam:      General: calm, no apparent distress  HEENT: normocephalic, atraumatic  Lungs: non-labored  Heart: S1, S2; RRR  Abdomen: obese, + bowel sounds, non-distended  Extremities: trace BLE edema  Skin: warm, dry; no rashes or lesions to exposed skin  Neurologic: alert, calm    Artificial airway:  None                                                                                         Ventilator/ Respiratory Therapy:  No     Vent weaning trial:  Not applicable      Laboratory:  LABS:  Recent Labs     12/11/24  1447 12/12/24  0354 12/13/24  0314 12/14/24  0334   NA 140 142 142 145   K 3.6 3.7 3.2* 3.0*   CL 92* 95* 98 101   CO2 39* 35* 34* 31*   GAP 9 12 10  13*   BUN 10 15 16 12    CR 0.73 0.69 0.60 0.69   GLU 108* 101* 107* 140*   CA 9.3 9.3 9.3 9.1   ALBUMIN  --  4.0 4.1 3.7   MG  --  2.1 2.1 2.1   PO4  --  2.9 2.4 4.4        Recent Labs     12/12/24  0354 12/13/24  0314 12/14/24  0334   WBC 16.00* 17.00* 20.60*   HGB 14.5 15.0 15.0   HCT 43.7 45.6* 45.8*   PLTCT 509* 533* 542*   AST 14 12 11    ALT 8 10 8    ALKPHOS 80 71 73      Estimated Creatinine Clearance: 129.5 mL/min (by C-G formula based on SCr of 0.69 mg/dL).  Vitals:    12/11/24 0500 12/12/24 0500 12/13/24 0500   Weight: 113.9 kg (251 lb 1.7 oz) 115.7 kg (255 lb 1.2 oz) 112.6 kg (248 lb 3.8 oz)      Recent Labs     12/12/24  0413   PHART 7.44   PO2ART 73*                 Radiology and Other Diagnostic Procedures Review:    Reviewed pertinent studies.

## 2024-12-14 NOTE — Care Coordination-Inpatient [600030]
 Med ICU 2 - 1948 will take calls on this patient until 8:00 AM. Afterwards, please contact first on call for the designated service listed on chart.         AOD

## 2024-12-15 MED ORDER — FLUCONAZOLE 150 MG PO TAB
150 mg | Freq: Once | ORAL | 0 refills | Status: CP
Start: 2024-12-15 — End: ?
  Administered 2024-12-15: 21:00:00 150 mg via ORAL

## 2024-12-15 MED ORDER — POTASSIUM CHLORIDE 20 MEQ PO TBTQ
40 meq | Freq: Once | ORAL | 0 refills | Status: CP
Start: 2024-12-15 — End: ?
  Administered 2024-12-15: 14:00:00 40 meq via ORAL

## 2024-12-15 NOTE — Progress Notes [1]
 Day 0700-1900    Pain: The patient is not having any pain    Activity: Ambulation Episodes: 3 walks during shift    Acute Events, Nursing Intervention or Provider Communication: na    Patient Interventions and Education   Fall Risk/JHFRAT Interventions and Education: (Charting when applicable)   Elimination Interventions: Bed pan available in room   Medications: Educate patient on medication side effects   Patient Care Equipment: N/A   Mobility: N/A   Cognition: N/A   Risk for Moderate/Major Injury: N/A     Restraints: No  Restraints Goal: choice: N/A  See Docflowsheet for restraint documentation, interventions, education, etc.    Intake and Output:      Date 12/14/24 0701 - 12/15/24 0700 12/15/24 0701 - 12/16/24 0700   Shift 0701-1900 1901-0700 24 Hour Total 0701-1900 1901-0700 24 Hour Total   INTAKE   P.O. 2186 222 2408 2624  2624   Shift Total(mL/kg) 7813(80.2) 222(2) 7591(78.6) 7375(76.7)  7375(76.7)   OUTPUT   Urine(mL/kg/hr) 1575(1.2) 25(0) 1600(0.6) 1130  1130     Urine 1575 25 1600 1130  1130   Other 0  0        Stool (ml) 0  0        Stool Occurrence  0 x 0 x        Urine Occurrence  3 x 1 x 4 x 2 x  2 x   Shift Total(mL/kg) 1575(14.2) 25(0.2) 1600(14.2) 1130(10)  1130(10)   NET 611 [phone]  1494   Weight (kg) 111.1 112.9 112.9 112.9 112.9 112.9         This RN provided education to patient. The following education topics were reviewed with patient: Medication zosyn  Patient Verbalizes Understanding     Patient Belongings observed in room during this RN's shift: Home Medications placed in temp narc storage in [place] by admitting nurse.     Discharge Plan: Discharge Planning: Patient would like to eat breakfast before DC

## 2024-12-15 NOTE — Progress Notes [1]
 Internal Medicine Progress Note    Sandra Rios  Today's Date:  12/15/2024  Admission Date: 12/11/2024  LOS: 4 days    Principal Problem:    Acute on chronic respiratory failure with hypoxia and hypercapnia (CMS-HCC)  Active Problems:    Carboxyhemoglobinemia    Multifocal pneumonia    On mechanically assisted ventilation (CMS-HCC)    Sepsis, unspecified organism (CMS-HCC)    Acute metabolic encephalopathy    Ground-level fall    Chronic pain syndrome    Brief Hospital Course:      Sandra Rios is a 43 y.o. female w/ PMH of bipolar disorder, chronic pain syndrome, current tobacco use, COPD, chronic hypoxic respiratory failure on 2-3L NC at baseline, morbid obesity, hypothyroidism, & SLE. Several recent admits to OSH this year for hypoxic respiratory failure; most recently 12/5 - 12/10 requiring NIV & treated for multifocal PNA.    Presented to [facility] 12/16 w/ loss of consciousness following ground level fall with head strike. Trauma workup negative. ABG w/ acute on chronic hypoxia and hypercapnia so placed on NIPPV. Imaging w/ worsening multifocal pneumonia. Also noted to have elevated carboxyhemoglobin level. Transferred to [facility] 12/17 for further management d/t persistent hypercapnia.    Continued to be lethargic w/ hypercapnia despite maximizing NIV settings. S/p Narcan  w/ improvement so started on narcan  gtt; transitioned off NIV. Hyperbaric medicine consulted but pt not candidate for therapy given NIV requirement & improving carboxyhemoglobin level.     Narcan  gtt later stopped d/t intermittent agitation; required Precedex  gtt for agitation, now off.   Psych consulted given significant Psych hx & hx severe agitation; have slowly resumed PTA meds.  Infectious w/u in process; plan for ~5d course abx w/ zosyn  for CAP coverage.    Assessment/Plan:      NEURO  Acute Metabolic Encephalopathy, resolved   Fall  Bipolar Disorder  Anxiety/Depression  Chronic Pain  - Encephalopathy likely 2/2 profound hypercapnia  - PTA cyclobenzaprine  10 mg TID PRN, doxepin  100 mg BID, hydrocodone -acetaminophen  5/325 mg TID PRN, ibuprofen 200 mg q6 PRN, Lyrica  300 mg BID, Seroquel  50 mg BID, trazodone  150 mg qHS PRN, invega Sustenna 234mg  q30 days  - Has had agitation requiring psychiatry consult during past inpatient admissions at [facility]  - Presented to OSH 12/16 after witnessed ground level fall at home; trauma workup negative at OSH ED  - OSH CT Head 12/16: No acute intracranial findings. No panenchymal changes to indicate carbon monoxide poisoning.  - OSH CT C-Spine w/o 12/16: No acute osseous injury of the cervical spine.  - OSH ETOH 12/16: < 10  - OSH UDS 12/16: + TCA, THC, opiates (expected w/ PTA meds)  - On admit to MICU 12/17 was lethargic but arousable -> administered Narcan  w/ improvement so started gtt  - Later w/ intermittent agitation -> Narcan  gtt stopped, low dose PTA Seroquel  resumed, & Psych consulted  - Required addition of Precedex  12/17 PM; off as of 12/19 AM   PLAN  - Psych following         - Seroquel  50mg  BID          - Haldol  5mg  Q6 PRN agitation         - Continue PTA Norco          - Resume PTA Lyrica  at half dose, PTA doxepin , & PTA cyclobenzaprine          - Hold remaining PTA meds  - CO at bedside -- pt reported SI to  Psych on 12/18  - Feels better, has some chronic back pain     PULM  Acute on Chronic Respiratory Failure with Hypoxia and Hypercapnia, resolved   Multifocal Pneumonia  Carboxyhemoglobinemia  COPD  OSA/OHS?  - Unclear cause of acute on chronic respiratory failure; ddx COPD exacerbation vs worsening of pneumonia vs ?aspiration in the setting of LOC/altered mental status  - PTA Duoneb TID  - Pt reports baseline 3L   - [facility] admit 07/2024 - 08/2024 - required intubation & proning for ARDS; [facility] admit 11/29/24 - 12/04/24 with pneumonia, developed hypoxia & hypercapnia requiring NIPPV  - Presented to OSH 12/16 after fall as above; found to be hypoxic & hypercapnic -> NIV  - OSH carboxyhemoglobin: 21.2 -> 16.7 (reportedly family did not allow EMS to check carbon monoxide levels in the house); level at [facility] improved, down to 7.7  - OSH CTA Chest 12/16: No PE. Patchy linear consolidation throughout the lungs, similar to the previous exam. Left lower lobe opacification has mildly progressed since the previous exam. Findings most compatible with multifocal pneumonia.  - Continued on NIV upon admit w/ MICU w/ worsening hypercapnia despite maximizing settings (7.18 / 117) -> given narcan  as above w/ improvement  - Evaluated by Hyperbaric medicine earlier this admit & was deemed not a candidate d/t NIV requirement  - I/O: net - 1.8L   - Transitioned off NIV 12/17 though wore overnight  - Currently 3L NC  PLAN  - Duonebs Q6 & PRN  - Continue prednisone  40mg  daily x5d thru 12/22  - Rec repeat CT in 4-6 weeks   - ID as below     CV  HTN  HLD  - PTA furosemide  40 mg daily, metoprolol  tartrate 50 mg BID, rosuvastatin  20 mg qHS, spironolactone  50 mg daily  - ECG 12/16: Sinus tach, HR 110, QTC 452  - HR 70s, SBP 110s-120s  PLAN  - Continue PTA metoprolol  at decreased dose 25mg  BID  - Continue PTA furosemide , spironolactone , rosuvastatin       GI  Morbid Obesity  - Last BM ~12/18  - Regular diet  - Miralax  daily     RENAL  - I/O as above   - CMP, mg, phos daily      ENDO  Hypothyroidism  - Labs from 12/17: TSH 5.24, free T4 WNL  - Continue PTA levothyroxine  125 mcg daily     ID  Leukocytosis  Multifocal Pneumonia  Sepsis  Hx Stenotrophomonas Pneumonia (September 2025)  - Recent admits to OSH w/ pneumonia; treated for steno PNA September 2025 w/ levaquin, recently treated again for PNA w/ levaquin (finished course 12/09/24)  - Endorses that her mother has been sick with URI, denies infectious prodrome  - Procal 0.08  - Urine cx 12/17: negative   - MRSA PCR, RVP 12/17: negative  - Blood cx 12/17: NGTD  - WBC 17, afebrile  PLAN  - Continue empiric Zosyn , anticipate 5d course  - Sputum cx ordered, pending collection      HEME  - Daily CBC  - Lovenox  ppx     PPX:  Lines: PIVs  Drains/Tubes: foley -- remove foley  DVT: Lovenox   GI: not indicated     Code Status: Full  Disposition: Discharge pending clinical improvement    Patient seen and discussed with Dr Ivie Gerre Pore, MD  PGY-3 - Internal Medicine  On [facility] - Pager [phone]    __________________________________________________________________________________    Subjective:  Patient was doing better this morning. Felt back to her baseline. Discussed plan for potential discharge tomorrow.     Objective:     Medications:  Scheduled Meds:doxepin  (SINEquan ) capsule 100 mg, 100 mg, Oral, BID  enoxaparin  (LOVENOX ) syringe 40 mg, 40 mg, Subcutaneous, BID  furosemide  (LASIX ) tablet 40 mg, 40 mg, Oral, QDAY  levothyroxine  (SYNTHROID ) tablet 125 mcg, 125 mcg, Oral, QDAY  metoprolol  tartrate tablet 25 mg, 25 mg, Oral, BID  nystatin  (NYSTOP ) topical powder, , Topical, QDAY  piperacillin /tazobactam (ZOSYN ) 4.5 g in sodium chloride  0.9% (NS) 100 mL IVPB (MB+), 4.5 g, Intravenous, Q6H*  polyethylene glycol 3350  (MIRALAX ) packet 17 g, 1 packet, Oral, BID  predniSONE  (DELTASONE ) tablet 40 mg, 40 mg, Oral, QDAY  pregabalin  (LYRICA ) capsule 150 mg, 150 mg, Oral, BID  QUEtiapine  (SEROquel ) tablet 50 mg, 50 mg, Oral, BID  rosuvastatin  (CRESTOR ) tablet 20 mg, 20 mg, Oral, QDAY  spironolactone  (ALDACTONE ) tablet 50 mg, 50 mg, Oral, QDAY  traZODone  (DESYREL ) tablet 100 mg, 100 mg, Oral, QHS    Continuous Infusions:   sodium chloride  0.9% TKO infusion 5 mL/hr at 12/14/24 0151     PRN and Respiratory Meds:acetaminophen  Q6H PRN, albuterol -ipratropium Q6H PRN, calcium  carbonate Q4H PRN, cyclobenzaprine  TID PRN, haloperidoL  Q6H PRN **OR** haloperidol  lactate Q6H PRN, HYDROcodone /acetaminophen  Q8H PRN, labetalol  Q4H PRN, risperiDONE  QHS PRN                       Vital Signs: Last Filed                  Vital Signs: 24 Hour Range   BP: 141/71 (12/21 0722)  Temp: 36.3 ?C (97.4 ?F) (12/21 9277)  Pulse: 91 (12/21 0722)  Respirations: 17 PER MINUTE (12/21 0722)  SpO2: 92 % (12/21 0722)  O2 Device: High flow nasal cannula (12/21 0722)  O2 Liter Flow: 3 Lpm (12/21 0722) BP: (98-141)/(66-71)   Temp:  [36.3 ?C (97.4 ?F)-36.7 ?C (98.1 ?F)]   Pulse:  [84-96]   Respirations:  [17 PER MINUTE-20 PER MINUTE]   SpO2:  [92 %-98 %]   O2 Device: High flow nasal cannula  O2 Liter Flow: 3 Lpm    Intensity Pain Scale (Self Report): 6 (12/15/24 0849) Vitals:    12/13/24 0500 12/14/24 0740 12/15/24 0306   Weight: 112.6 kg (248 lb 3.8 oz) 111.1 kg (244 lb 14.9 oz) 112.9 kg (248 lb 14.4 oz)           Intake/Output Summary:  (Last 24 hours)    Intake/Output Summary (Last 24 hours) at 12/15/2024 9040  Last data filed at 12/15/2024 0800  Gross per 24 hour   Intake 2158 ml   Output 1000 ml   Net 1158 ml         Physical Exam:      General: calm, no apparent distress  HEENT: normocephalic, atraumatic  Lungs: non-labored  Heart: S1, S2; RRR  Abdomen: obese, + bowel sounds, non-distended  Extremities: trace BLE edema  Skin: warm, dry; no rashes or lesions to exposed skin  Neurologic: alert, calm    Artificial airway:  None  Ventilator/ Respiratory Therapy:  No     Vent weaning trial:  Not applicable      Laboratory:  LABS:  Recent Labs     12/13/24  0314 12/14/24  0334 12/15/24  0343   NA 142 145 144   K 3.2* 3.0* 3.7   CL 98 101 98   CO2 34* 31* 36*   GAP 10 13* 10   BUN 16 12 14    CR 0.60 0.69 0.67   GLU 107* 140* 144*   CA 9.3 9.1 9.3   ALBUMIN 4.1 3.7 3.9   MG 2.1 2.1 2.0   PO4 2.4 4.4 3.9        Recent Labs     12/13/24  0314 12/14/24  0334 12/15/24  0343   WBC 17.00* 20.60* 16.70*   HGB 15.0 15.0 14.1   HCT 45.6* 45.8* 44.2   PLTCT 533* 542* 529*   AST 12 11 13    ALT 10 8 13    ALKPHOS 71 73 63      Estimated Creatinine Clearance: 129.7 mL/min (by C-G formula based on SCr of 0.67 mg/dL).  Vitals:    12/13/24 0500 12/14/24 0740 12/15/24 0306   Weight: 112.6 kg (248 lb 3.8 oz) 111.1 kg (244 lb 14.9 oz) 112.9 kg (248 lb 14.4 oz)      No results for input(s): PHART, PO2ART in the last 72 hours.    Invalid input(s): PC02A                Radiology and Other Diagnostic Procedures Review:    Reviewed pertinent studies.

## 2024-12-16 ENCOUNTER — Encounter: Admit: 2024-12-16 | Discharge: 2024-12-16 | Payer: MEDICARE

## 2024-12-16 ENCOUNTER — Inpatient Hospital Stay: Admit: 2024-12-11 | Payer: MEDICARE

## 2024-12-16 VITALS — BP 122/73 | HR 94 | Temp 97.30000°F | Ht 65.5 in | Wt 248.9 lb

## 2024-12-16 DIAGNOSIS — A419 Sepsis, unspecified organism: Principal | ICD-10-CM

## 2024-12-16 DIAGNOSIS — Z8261 Family history of arthritis: Secondary | ICD-10-CM

## 2024-12-16 DIAGNOSIS — Z79891 Long term (current) use of opiate analgesic: Secondary | ICD-10-CM

## 2024-12-16 DIAGNOSIS — J9622 Acute and chronic respiratory failure with hypercapnia: Secondary | ICD-10-CM

## 2024-12-16 DIAGNOSIS — Z77028 Contact with and (suspected) exposure to other hazardous aromatic compounds: Secondary | ICD-10-CM

## 2024-12-16 DIAGNOSIS — J440 Chronic obstructive pulmonary disease with acute lower respiratory infection: Secondary | ICD-10-CM

## 2024-12-16 DIAGNOSIS — G894 Chronic pain syndrome: Secondary | ICD-10-CM

## 2024-12-16 DIAGNOSIS — J189 Pneumonia, unspecified organism: Secondary | ICD-10-CM

## 2024-12-16 DIAGNOSIS — Z86711 Personal history of pulmonary embolism: Secondary | ICD-10-CM

## 2024-12-16 DIAGNOSIS — Z781 Physical restraint status: Secondary | ICD-10-CM

## 2024-12-16 DIAGNOSIS — R45851 Suicidal ideations: Secondary | ICD-10-CM

## 2024-12-16 DIAGNOSIS — G9341 Metabolic encephalopathy: Secondary | ICD-10-CM

## 2024-12-16 DIAGNOSIS — F17210 Nicotine dependence, cigarettes, uncomplicated: Secondary | ICD-10-CM

## 2024-12-16 DIAGNOSIS — Z79899 Other long term (current) drug therapy: Secondary | ICD-10-CM

## 2024-12-16 DIAGNOSIS — I10 Essential (primary) hypertension: Secondary | ICD-10-CM

## 2024-12-16 DIAGNOSIS — J9621 Acute and chronic respiratory failure with hypoxia: Secondary | ICD-10-CM

## 2024-12-16 DIAGNOSIS — Z87820 Personal history of traumatic brain injury: Secondary | ICD-10-CM

## 2024-12-16 DIAGNOSIS — Z6841 Body Mass Index (BMI) 40.0 and over, adult: Secondary | ICD-10-CM

## 2024-12-16 DIAGNOSIS — Z87442 Personal history of urinary calculi: Secondary | ICD-10-CM

## 2024-12-16 DIAGNOSIS — E785 Hyperlipidemia, unspecified: Secondary | ICD-10-CM

## 2024-12-16 DIAGNOSIS — Z79890 Hormone replacement therapy (postmenopausal): Secondary | ICD-10-CM

## 2024-12-16 DIAGNOSIS — F419 Anxiety disorder, unspecified: Secondary | ICD-10-CM

## 2024-12-16 DIAGNOSIS — Z8673 Personal history of transient ischemic attack (TIA), and cerebral infarction without residual deficits: Secondary | ICD-10-CM

## 2024-12-16 DIAGNOSIS — E039 Hypothyroidism, unspecified: Secondary | ICD-10-CM

## 2024-12-16 LAB — CULTURE-BLOOD W/SENSITIVITY

## 2024-12-16 MED ORDER — ROSUVASTATIN 20 MG PO TAB
20 mg | ORAL_TABLET | Freq: Every day | ORAL | 3 refills | 90.00000 days | Status: AC
Start: 2024-12-16 — End: ?

## 2024-12-16 MED ORDER — METOPROLOL TARTRATE 25 MG PO TAB
25 mg | ORAL_TABLET | Freq: Two times a day (BID) | ORAL | 0 refills | Status: CN
Start: 2024-12-16 — End: ?

## 2024-12-16 MED ORDER — PREGABALIN 150 MG PO CAP
150 mg | ORAL_CAPSULE | Freq: Two times a day (BID) | ORAL | 0 refills | 30.00000 days | Status: AC
Start: 2024-12-16 — End: ?

## 2024-12-16 NOTE — Discharge Instr - Case Management [600072]
 You have a follow up appointment with your primary care provider Dr. Lesley Guillaume scheduled on Monday January 20, 2025 at 1pm at [facility] ([address] , [place], [place] [address]). At this appointment, your doctor will review the results of your CT scan to make sure your lungs are healing properly. Please call Dr. Issac office at [phone] if you need to reschedule this appointment.

## 2024-12-16 NOTE — Progress Notes [1]
 Psychiatric Consultation Progress Note    LOS: 5 days    Current Psychotropic Meds:   Invega 324 mg Q4w for schizoaffective disorder bipolar type  Last given 12/06/24; Next due ~01/06/25  Doxepin  100 mg BID for anxiety  Lyrica  150 mg BID for chronic pain  Trazodone  100 mg qhs for sleep  Seroquel  25 mg BID for agitation  Haldol  5 mg PO/IM Q6H PRN for agitation  Risperidone  1 mg qhs prn for hallucinations    Psychiatric Assessment:  Acute metabolic encephalopathy, resolved  Schizoaffective disorder, Bipolar type  Hx of TBI  Chronic pain with chronic opioid use  Fall  Acute on chronic respiratory failure with hypoxia and hypercapnia  Carboxyhemoglobinemia    Recommendations:   Recommended Psychotropics to be continued upon discharge:  Invega injection (as below)  Trazodone  150 mg qhs for sleep  Doxepin  100 mg BID for anxiety  Lyrica  150 mg BID for chronic pain  DC Seroquel    DC PRN Risperidone /Haldol   Engaged pharmacy help in determining date of last administered Invega injection  Last filled invega 324 mg injection on 12/12 at [facility] pharmacy (one she's filled other prescriptions at recently)  Patient able to confirm she believes approximately 12/12 is accurate date for last Invega injection  Can discontinue CO  Psychiatric barriers to discharge: None   Psychiatry will sign off. Should plans change or questions arise, please re-engage psychiatry at that time      Seen and discussed with: Dr. Celinda    Please feel free to contact us  with any additional questions or concerns by paging the consult team between 8am and 5pm on weekdays and between 8am and 3pm on weekends at [phone]. Otherwise, page the psychiatry resident on call.    --------------------------------------------------------------------------------------------------------------------------------  Subjective:  Sandra Rios was seen for follow up today.    Notable events since last seen: Patient did not require PRNs, remains without restraints, and was compliant with all scheduled medications.      Today, she is doing well. She has been progressing well towards discharge and plan per primary should be to discharge her home today. She denies any continued confusion and is fully oriented to conversation today. She denies suicidal thoughts and reports that the last time these were present would have been 12/18 when she was still confused. Reports last incident outside of the delirium episode was well before that. Her hallucinations are at baseline and while anxiety is present as it always is the idea of going home today is helping mitigate that.    On direct questioning today, patient:    - Denies SI/plan/intent.   - Denies HI/plan/intent.   - Endorses AVH - at her baseline where she is able to ignore them. is not observed responding to internal stimuli.    Review of Systems   Constitutional: Negative.    HENT: Negative.     Eyes: Negative.    Respiratory: Negative.          Returned to PTA oxygen requirement   Cardiovascular: Negative.    Gastrointestinal: Negative.    Genitourinary: Negative.    Musculoskeletal:         Chronic pain - at her baseline; improved with lyrica  use   Neurological: Negative.    Psychiatric/Behavioral:  Positive for hallucinations. Negative for depression and suicidal ideas. The patient is nervous/anxious. The patient does not have insomnia.         AVH at baseline, able to ignore; not currently distressing  Objective:                   Vital Signs:  Current                Vital Signs: 24 Hour Range   BP: 126/69 (12/22 0739)  Temp: 36.4 ?C (97.6 ?F) (12/22 9260)  Pulse: 93 (12/22 0739)  Respirations: 18 PER MINUTE (12/22 0739)  SpO2: 93 % (12/22 0739)  O2 Device: High flow nasal cannula (12/22 0739)  O2 Liter Flow: 4 Lpm (12/22 0739) BP: (126-144)/(62-70)   Temp:  [36 ?C (96.8 ?F)-37 ?C (98.6 ?F)]   Pulse:  [88-100]   Respirations:  [18 PER MINUTE-20 PER MINUTE]   SpO2:  [92 %-98 %]   O2 Device: High flow nasal cannula  O2 Liter Flow: 4 Lpm    Intensity Pain Scale (Self Report): (not recorded)      Scheduled Medications:  doxepin  (SINEquan ) capsule 100 mg, 100 mg, Oral, BID  enoxaparin  (LOVENOX ) syringe 40 mg, 40 mg, Subcutaneous, BID  furosemide  (LASIX ) tablet 40 mg, 40 mg, Oral, QDAY  levothyroxine  (SYNTHROID ) tablet 125 mcg, 125 mcg, Oral, QDAY  metoprolol  tartrate tablet 25 mg, 25 mg, Oral, BID  nystatin  (NYSTOP ) topical powder, , Topical, QDAY  polyethylene glycol 3350  (MIRALAX ) packet 17 g, 1 packet, Oral, BID  pregabalin  (LYRICA ) capsule 150 mg, 150 mg, Oral, BID  QUEtiapine  (SEROquel ) tablet 50 mg, 50 mg, Oral, BID  rosuvastatin  (CRESTOR ) tablet 20 mg, 20 mg, Oral, QDAY  spironolactone  (ALDACTONE ) tablet 50 mg, 50 mg, Oral, QDAY  traZODone  (DESYREL ) tablet 100 mg, 100 mg, Oral, QHS        PRN Medications:  acetaminophen  Q6H PRN 1,000 mg at 12/12/24 2013, albuterol -ipratropium Q6H PRN, calcium  carbonate Q4H PRN 500 mg at 12/15/24 0747, cyclobenzaprine  TID PRN 10 mg at 12/16/24 0813, haloperidoL  Q6H PRN **OR** haloperidol  lactate Q6H PRN 5 mg at 12/11/24 2039, HYDROcodone /acetaminophen  Q8H PRN 1 tablet at 12/16/24 0805, risperiDONE  QHS PRN      Mental Status Exam:   General/Constitutional: dressed in hospital attire; alert and engaged; not in any acute distress; nasal cannula is in place  Speech: Regular rate, rhythm, tone and volume; nonpressured; good articulation  Motor: No evidence of PMA/PMR, abnormal/involuntary movements  Mood/Affect: Good/euthymic, fatigued - waking up at time of interview  Thought Process: Linear and goal oriented  Associations: Intact  Thought Content: denies SI/HI.  No evidence of delusions or paranoia  Perception: endorses baseline AVH. Not overtly responding to internal stimuli  Insight/Judgment: good/good  _____________________________________________________     Richerd Na, MD  Psychiatry Resident, PGY-2  This note was in part completed using Dragon, a voice recognition software. Some grammatical errors may have occurred. If you have concerns, please contact the dino of this note for clarification.

## 2024-12-16 NOTE — Case Mgmt DC Plan [600024]
 Patient Transportation Request    Received request from [facility], [facility] to get price quotes for wheelchair fleeta to transfer pt to address on file today around 1430. Talty to pay. 3 L oxygen.       [facility] [phone]: $554.00 - available at 1600  [facility] [phone]: $433.00 - could do 1430    Scheduled pickup at 1430 with [facility].     Vernell Shope  Case Management Assistant  For further assistance please contact RNCM

## 2024-12-16 NOTE — Discharge Summary [5]
 Discharge Summary      Name: Sandra Rios  Medical Record Number: [id number]        Account Number:  [id number]  Date Of Birth:  May 27, 1981                         Age:  43 y.o.  Admit date:  12/11/2024                     Discharge date: 12/16/2024      Discharge Attending:  Dr Emery Edis, MD    Discharge Summary Completed By: Marvella Pottier, MBBS    Service: Med 3- 2071    Reason for hospitalization:  Acute on chronic respiratory failure with hypoxia and hypercapnia (CMS-HCC) [phone], J96.22]    Primary Discharge Diagnosis:   Acute on chronic respiratory failure with hypoxia and hypercapnia (CMS-HCC)    Hospital Diagnoses:  Hospital Problems        Active Problems    * (Principal) Acute on chronic respiratory failure with hypoxia and   hypercapnia (CMS-HCC)    Carboxyhemoglobinemia    Multifocal pneumonia    On mechanically assisted ventilation (CMS-HCC)    Sepsis, unspecified organism (CMS-HCC)    Acute metabolic encephalopathy    Ground-level fall    Chronic pain syndrome     Present on Admission:   Acute on chronic respiratory failure with hypoxia and hypercapnia (CMS-HCC)   Carboxyhemoglobinemia   Multifocal pneumonia   On mechanically assisted ventilation (CMS-HCC)   Sepsis, unspecified organism (CMS-HCC)   Acute metabolic encephalopathy   Ground-level fall   Chronic pain syndrome        Significant Past Medical History        ADHD (attention deficit hyperactivity disorder)  Anxiety  Bladder infection, chronic  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)      Comment:  tia in 2010  Degenerative disc disease  Degenerative disc disease, lumbar  Degenerative disc disease, thoracic  Diverticulitis of colon (without mention of hemorrhage)(562.11)  Generalized headaches  Joint pain  Kidney disease  Kidney stones      Comment:  on and off since i was 13  Lupus  Nerve injury      Comment:  several over the years starting around 2008  Pulmonary embolism (CMS-HCC)      Comment: lung  Spinal stenosis  Thyroid disorder    Allergies   Latex, Morphine, Rhinocort [budesonide], and Cortisone    Brief Hospital Course     Sandra Rios is a 43 year old female with a history of chronic hypoxic respiratory failure (baseline 2-3L O2), COPD, morbid obesity, current tobacco use, bipolar disorder, schizoaffective disorder, chronic pain syndrome, hypothyroidism, and SLE, who was admitted with acute on chronic respiratory failure with hypoxia and hypercapnia following a witnessed ground-level fall with head strike and loss of consciousness.    On presentation, she was found to have profound hypercapnia and hypoxia, and imaging revealed worsening multifocal pneumonia without evidence of pulmonary embolism. She was also noted to have significantly elevated carboxyhemoglobin, without a clear source of carbon monoxide exposure, and was not a candidate for hyperbaric oxygen therapy due to her need for noninvasive ventilation (NIV). Trauma workup, including CT head and cervical spine, was negative for acute findings.    Her hospital course was complicated by persistent hypercapnia and lethargy despite escalation of NIV settings, requiring administration of naloxone  with subsequent improvement in mental status, and a brief naloxone   infusion was initiated and later discontinued due to agitation. She was transitioned off NIV as her ventilation improved and was managed with high-flow nasal cannula, with oxygen requirements gradually decreasing to PTA 3L by discharge.    For multifocal pneumonia and sepsis, she was treated with empiric antibiotics including piperacillin -tazobactam, with vancomycin  and doxycycline  discontinued after negative MRSA PCR and improvement in clinical status.She also received a five-day course of prednisone  for possible COPD exacerbation.    Her hospital course was further complicated by acute metabolic encephalopathy, likely secondary to hypercapnia, with significant agitation and behavioral disturbances requiring psychiatry consultation, resumption of quetiapine  at a reduced dose, and as-needed haloperidol  for agitation. She required a brief dexmedetomidine  infusion for agitation, which was discontinued as her mental status improved. She reported suicidal ideation during the admission, necessitating constant observation and ongoing psychiatric follow-up.    Other issues addressed during admission included hypertension and tachycardia, managed with a reduced dose of metoprolol , and diuresis for volume overload. Her home medications for hypothyroidism, chronic pain, and psychiatric conditions were gradually resumed as her encephalopathy resolved.    Day of discharge exam notable for: By the end of her ICU and floor stay, her acute on chronic respiratory failure and metabolic encephalopathy had resolved, she was weaned to her baseline oxygen requirement, and her agitation and psychiatric symptoms were controlled with her home regimen and as-needed medications. She remained hemodynamically stable, afebrile, and was appropriate for discharge home with continued oxygen and psychiatric follow-up.     Pt reported that she has 2 CO detectors at home and neither have detected CO on them so far.     General: calm, no apparent distress  HEENT: normocephalic, atraumatic  Lungs: non-labored, lungs clear and improved, oxygen requirement at BL  Heart: S1, S2; RRR  Abdomen: obese, + bowel sounds, non-distended  Extremities: trace BLE edema  Skin: warm, dry; no rashes or lesions to exposed skin  Neurologic: alert, calm      Items Needing Follow Up   Pending items or areas that need to be addressed at follow up:     > Needs repeat CT scan in 4-6 weeks to eval for resolution of multifocal PNA    Pending Labs and Follow Up Radiology    Pending labs and/or radiology review at this time of discharge are listed below: Please note- any labs with collected status will not have a result; if this area is blank, there are no items for review.     > Ct scan as mentioned above    Medications        Medication List        START taking these medications      rosuvastatin  20 mg tablet  Commonly known as: CRESTOR   Dose: 20 mg  Take one tablet by mouth daily. Indications: hardening of the arteries due to plaque buildup  For: hardening of the arteries due to plaque buildup  Quantity: 90 tablet  Refills: 3  Start taking on: December 17, 2024            CHANGE how you take these medications      pregabalin  150 mg capsule  Commonly known as: LYRICA   Dose: 150 mg  Take one capsule by mouth twice daily. Indications: neuropathic pain  For: neuropathic pain  Quantity: 60 capsule  Refills: 0  What changed:   medication strength  how much to take            CONTINUE taking these medications  albuterol  sulfate 90 mcg/actuation HFA aerosol inhaler  Commonly known as: PROAIR  HFA  Dose: 2 puff  Inhale two puffs by mouth into the lungs every 4 hours as needed.  Refills: 0     cyanocobalamin (vitamin B-12) 1,000 mcg tablet  Dose: 1,000 mcg  Take one tablet by mouth every morning.  Refills: 0     cyclobenzaprine  10 mg tablet  Commonly known as: FLEXERIL   Dose: 10 mg  Take one tablet by mouth three times daily as needed (for muscle spasms).  Refills: 0     doxepin  100 mg capsule  Commonly known as: SINEQUAN   Dose: 100 mg  Take one capsule by mouth twice daily.  Refills: 0     furosemide  40 mg tablet  Commonly known as: LASIX   Dose: 40 mg  Take one tablet by mouth daily.  Refills: 0     HYDROcodone /acetaminophen  5/325 mg tablet  Commonly known as: NORCO  Dose: 1 tablet  Take one tablet by mouth three times daily as needed.  Refills: 0     INVEGA SUSTENNA 234 mg/1.5 mL injection  Generic drug: paliperidone palmitate  Dose: 234 mg  Inject 1.5 mL into the muscle every 30 days.  Refills: 0     levothyroxine  125 mcg tablet  Commonly known as: SYNTHROID   Dose: 125 mcg  Take one tablet by mouth daily.  Refills: 0     metoprolol  tartrate 50 mg tablet  Commonly known as: LOPRESSOR   Dose: 50 mg  Take one tablet by mouth twice daily.  Refills: 0     spironolactone  50 mg tablet  Commonly known as: ALDACTONE   Dose: 50 mg  Take one tablet by mouth daily.  Refills: 0     traZODone  50 mg tablet  Commonly known as: DESYREL   Dose: 150 mg  Take three tablets by mouth at bedtime as needed for Sleep.  Refills: 0     varenicline tartrate 0.5 mg (11)- 1 mg (42) tablet in dose pack  Commonly known as: CHANTIX STARTING MONTH BOX  Take as directed per package instructions  Refills: 0            STOP taking these medications      fluconazole  150 mg tablet  Commonly known as: DIFLUCAN      oxyCODONE-acetaminophen  5-325 mg tablet  Commonly known as: PERCOCET     SEROQUEL  PO               Where to Get Your Medications        These medications were sent to [facility] - [place], [place] - [address]  [address], [place] [address]      Phone: [phone]   pregabalin  150 mg capsule  rosuvastatin  20 mg tablet         Return Appointments and Scheduled Appointments   No appointment scheduled    Consults, Procedures, Diagnostics, Micro, Pathology   Consults: Psychiatry and Hyperbaric physician  Surgical Procedures & Dates: None  Significant Diagnostic Studies, Micro and Procedures: noted in brief hospital course  Significant Pathology: noted in brief hospital course     Wound:      Wounds Pressure injury Sacrum (Active)   12/11/24 0003   Wound Type: Pressure injury   Orientation:    Location: Sacrum   Wound Location Comments:    Initial Wound Site Closure:    Initial Dressing Placed:    Initial Cycle:    Initial Suction  Setting (mmHg):    Pressure Injury Stages: Stage 1   Pressure Injury Present Within 24 Hours of Hospital Admission: Yes   If This Pressure Injury Is Suspected to Be Device Related, Please Select the Device::    Is the Wound Open or Closed:    Wound Assessment Pink;Red 12/16/24 0805   Peri-wound Assessment Dry;Intact 12/16/24 0805   Wound Drainage Amount None 12/16/24 0805   Wound Dressing Status Intact 12/16/24 0805   Wound Care Dressing changed or new application 12/11/24 0003   Wound Dressing and/or Treatment A & D ointment;Foam 12/11/24 0400   Number of days: 5       Wounds Pressure injury Right Back (Active)   12/11/24 0003   Wound Type: Pressure injury   Orientation: Right   Location: Back   Wound Location Comments:    Initial Wound Site Closure:    Initial Dressing Placed:    Initial Cycle:    Initial Suction Setting (mmHg):    Pressure Injury Stages: Stage 1   Pressure Injury Present Within 24 Hours of Hospital Admission: Yes   If This Pressure Injury Is Suspected to Be Device Related, Please Select the Device::    Is the Wound Open or Closed:    Wound Assessment Pink;Red 12/16/24 0805   Peri-wound Assessment Dry 12/16/24 0805   Wound Drainage Amount None 12/16/24 0805   Wound Dressing Status None/open to air 12/16/24 0805   Wound Care Treatment or ointment applied 12/12/24 2000   Wound Dressing and/or Treatment A & D ointment 12/13/24 2000   Number of days: 5                        Discharge Disposition, Condition   Patient Disposition: Home or Self Care [01]  Condition at Discharge: Stable    Code Status   Full Code    Patient Instructions     Activity       Activity as Tolerated   As directed      It is important to keep increasing your activity level after you leave the hospital.  Moving around can help prevent blood clots, lung infection (pneumonia) and other problems.  Gradually increasing the number of times you are up moving around will help you return to your normal activity level more quickly.  Continue to increase the number of times you are up to the chair and walking daily to return to your normal activity level. Begin to work toward your normal activity level at discharge          Diet       Low Fat / Low Cholesterol Diet   As directed      Your goal is to limit the amount of saturated and trans fats in your diet. Keep track of how much cholesterol you eat and limit the total amount to 200mg  (milligrams) a day.      If you have questions about your diet after you go home, you can call a dietitian at [phone].             Discharge education provided to patient., Signs and Symptoms:   Report these signs and symptoms       Report These Signs and Symptoms   As directed      Please contact your doctor if you have any of the following symptoms: temperature higher than 100.4 degrees F, uncontrolled pain, persistent nausea and/or vomiting, difficulty breathing, chest pain, severe abdominal pain,  headache, unable to urinate, unable to have bowel movement, drainage with a foul odor, or worsening shortness of breath or persistently increased oxygen needs        , and Education:     Additional Orders: Case Management, Supplies, Home Health     Home Health/DME                HOME HEALTH/DME  ONCE        Comments: Home Health/Durable Medical Equipment Order Details    Patient Name:  Sandia Pfund Routh                   Medical Record Number:   [id number]      Patient Height: 166.4 cm (5' 5.5)   Patient Weight: 112.6 kg (248 lb 3.8 oz)    Provider Information:    MD NPI: pending    Equipment Information & Instructions:      ICD10: J96.21, T58.91XA, J18.8,   Length of Need (0-99): 6 months      Test Date: pending   At Rest Only (Awake) Oxygen SAT: pending%   During Exercise without Oxygen SAT: pending%   During Exercise with Oxygen SAT: pending%   During Sleep Oxygen SAT:  pending%   O2 testing completed: pending    Pt will need tank delivered to room for discharge and concentrator to home.  Pt will need *LNC at rest and *LNC at exercise via tank/pulsitial device . Please titrate to conservation device.         I certify that this patient is under my care and that I, or a nurse practitioner or physician's assistant working with me, had a face-to-face encounter that meets the physician's face-to-face encounter requirements with this patient on [[pending]].    This patient is under my care, and I have initiated the establishment of the plan of care.  This patient will be followed by a physician after discharge, who will periodically review the plan of care.   Question Answer Comment   PCP Name/Contact [facility]   [phone]    [phone]    Home Health to Follow PCP                               Signed:  Marvella Pottier, MBBS  12/16/2024      cc:  Primary Care Physician:  Denzil Sinclair   Verified    Referring physicians:  Douglass Izetta Helling, NP   Additional provider(s):        Did we miss something? If additional records are needed, please fax a request on office letterhead to [phone]. Please include the patient's name, date of birth, fax number and type of information needed. Additional request can be made by email at [email or web address]. For general questions of information about electronic records sharing, call [phone].

## 2024-12-23 ENCOUNTER — Encounter: Admit: 2024-12-23 | Discharge: 2024-12-23 | Payer: MEDICARE

## 2025-01-18 ENCOUNTER — Encounter: Admit: 2025-01-18 | Discharge: 2025-01-18 | Payer: MEDICARE

## 2025-01-21 ENCOUNTER — Encounter: Admit: 2025-01-21 | Discharge: 2025-01-21 | Payer: MEDICARE
